# Patient Record
Sex: Male | Born: 1985 | Race: Black or African American | Hispanic: No | Marital: Married | Smoking: Former smoker
Health system: Southern US, Community
[De-identification: ages and names within clinical notes are randomized; demographics above are authoritative.]

## PROBLEM LIST (undated history)

## (undated) DIAGNOSIS — Z86718 Personal history of other venous thrombosis and embolism: Secondary | ICD-10-CM

## (undated) DIAGNOSIS — I2699 Other pulmonary embolism without acute cor pulmonale: Secondary | ICD-10-CM

---

## 2016-04-30 DIAGNOSIS — Z720 Tobacco use: Secondary | ICD-10-CM

## 2016-04-30 DIAGNOSIS — M79606 Pain in leg, unspecified: Secondary | ICD-10-CM

## 2016-04-30 DIAGNOSIS — I2699 Other pulmonary embolism without acute cor pulmonale: Secondary | ICD-10-CM

## 2016-04-30 DIAGNOSIS — I10 Essential (primary) hypertension: Secondary | ICD-10-CM

## 2016-06-05 DIAGNOSIS — I2699 Other pulmonary embolism without acute cor pulmonale: Secondary | ICD-10-CM

## 2016-08-21 DIAGNOSIS — N289 Disorder of kidney and ureter, unspecified: Secondary | ICD-10-CM

## 2016-08-21 DIAGNOSIS — Z7901 Long term (current) use of anticoagulants: Secondary | ICD-10-CM

## 2016-08-21 DIAGNOSIS — I10 Essential (primary) hypertension: Secondary | ICD-10-CM

## 2016-08-21 DIAGNOSIS — I2699 Other pulmonary embolism without acute cor pulmonale: Secondary | ICD-10-CM

## 2020-02-21 ENCOUNTER — Inpatient Hospital Stay (HOSPITAL_COMMUNITY)
Admission: AD | Admit: 2020-02-21 | Discharge: 2020-02-24 | DRG: 164 | Disposition: A | Discharge status: Home / Self Care | Payer: BC Managed Care – PPO | Source: Other Acute Inpatient Hospital | Attending: Family Medicine | Admitting: Family Medicine

## 2020-02-21 ENCOUNTER — Inpatient Hospital Stay (HOSPITAL_COMMUNITY): Payer: BC Managed Care – PPO

## 2020-02-21 DIAGNOSIS — I82442 Acute embolism and thrombosis of left tibial vein: Secondary | ICD-10-CM | POA: Diagnosis present

## 2020-02-21 DIAGNOSIS — I2692 Saddle embolus of pulmonary artery without acute cor pulmonale: Principal | ICD-10-CM | POA: Diagnosis present

## 2020-02-21 DIAGNOSIS — Z86718 Personal history of other venous thrombosis and embolism: Secondary | ICD-10-CM

## 2020-02-21 DIAGNOSIS — M7989 Other specified soft tissue disorders: Secondary | ICD-10-CM | POA: Diagnosis not present

## 2020-02-21 DIAGNOSIS — I10 Essential (primary) hypertension: Secondary | ICD-10-CM | POA: Diagnosis present

## 2020-02-21 DIAGNOSIS — F17210 Nicotine dependence, cigarettes, uncomplicated: Secondary | ICD-10-CM | POA: Diagnosis present

## 2020-02-21 DIAGNOSIS — I4510 Unspecified right bundle-branch block: Secondary | ICD-10-CM | POA: Diagnosis present

## 2020-02-21 DIAGNOSIS — I2602 Saddle embolus of pulmonary artery with acute cor pulmonale: Secondary | ICD-10-CM

## 2020-02-21 DIAGNOSIS — Z6841 Body Mass Index (BMI) 40.0 and over, adult: Secondary | ICD-10-CM

## 2020-02-21 DIAGNOSIS — E669 Obesity, unspecified: Secondary | ICD-10-CM | POA: Diagnosis present

## 2020-02-21 DIAGNOSIS — R000 Tachycardia, unspecified: Secondary | ICD-10-CM | POA: Diagnosis present

## 2020-02-21 DIAGNOSIS — I2699 Other pulmonary embolism without acute cor pulmonale: Secondary | ICD-10-CM

## 2020-02-21 DIAGNOSIS — I361 Nonrheumatic tricuspid (valve) insufficiency: Secondary | ICD-10-CM

## 2020-02-21 DIAGNOSIS — Z8249 Family history of ischemic heart disease and other diseases of the circulatory system: Secondary | ICD-10-CM

## 2020-02-21 DIAGNOSIS — F1290 Cannabis use, unspecified, uncomplicated: Secondary | ICD-10-CM | POA: Diagnosis present

## 2020-02-21 DIAGNOSIS — I2721 Secondary pulmonary arterial hypertension: Secondary | ICD-10-CM | POA: Diagnosis present

## 2020-02-21 DIAGNOSIS — I82432 Acute embolism and thrombosis of left popliteal vein: Secondary | ICD-10-CM | POA: Diagnosis present

## 2020-02-21 DIAGNOSIS — Z86711 Personal history of pulmonary embolism: Secondary | ICD-10-CM | POA: Diagnosis present

## 2020-02-21 DIAGNOSIS — Z20822 Contact with and (suspected) exposure to covid-19: Secondary | ICD-10-CM | POA: Diagnosis present

## 2020-02-21 DIAGNOSIS — I82452 Acute embolism and thrombosis of left peroneal vein: Secondary | ICD-10-CM | POA: Diagnosis present

## 2020-02-21 HISTORY — PX: IR ANGIOGRAM PULMONARY BILATERAL SELECTIVE: IMG664

## 2020-02-21 HISTORY — PX: IR ANGIOGRAM SELECTIVE EACH ADDITIONAL VESSEL: IMG667

## 2020-02-21 HISTORY — PX: IR THROMBECT VENO MECH MOD SED: IMG2300

## 2020-02-21 HISTORY — PX: IR US GUIDE VASC ACCESS RIGHT: IMG2390

## 2020-02-21 LAB — CBC WITH DIFFERENTIAL/PLATELET
Abs Immature Granulocytes: 0.38 10*3/uL — ABNORMAL HIGH (ref 0.00–0.07)
Basophils Absolute: 0.1 10*3/uL (ref 0.0–0.1)
Basophils Relative: 1 %
Eosinophils Absolute: 0.2 10*3/uL (ref 0.0–0.5)
Eosinophils Relative: 2 %
HCT: 44.9 % (ref 39.0–52.0)
Hemoglobin: 14.6 g/dL (ref 13.0–17.0)
Immature Granulocytes: 3 %
Lymphocytes Relative: 37 %
Lymphs Abs: 4.9 10*3/uL — ABNORMAL HIGH (ref 0.7–4.0)
MCH: 29.7 pg (ref 26.0–34.0)
MCHC: 32.5 g/dL (ref 30.0–36.0)
MCV: 91.4 fL (ref 80.0–100.0)
Monocytes Absolute: 0.8 10*3/uL (ref 0.1–1.0)
Monocytes Relative: 6 %
Neutro Abs: 6.8 10*3/uL (ref 1.7–7.7)
Neutrophils Relative %: 51 %
Platelets: 235 10*3/uL (ref 150–400)
RBC: 4.91 MIL/uL (ref 4.22–5.81)
RDW: 12.9 % (ref 11.5–15.5)
WBC: 13.1 10*3/uL — ABNORMAL HIGH (ref 4.0–10.5)
nRBC: 0 % (ref 0.0–0.2)

## 2020-02-21 LAB — COMPREHENSIVE METABOLIC PANEL
ALT: 123 U/L — ABNORMAL HIGH (ref 0–44)
AST: 76 U/L — ABNORMAL HIGH (ref 15–41)
Albumin: 4 g/dL (ref 3.5–5.0)
Alkaline Phosphatase: 55 U/L (ref 38–126)
Anion gap: 14 (ref 5–15)
BUN: 33 mg/dL — ABNORMAL HIGH (ref 6–20)
CO2: 17 mmol/L — ABNORMAL LOW (ref 22–32)
Calcium: 9.1 mg/dL (ref 8.9–10.3)
Chloride: 109 mmol/L (ref 98–111)
Creatinine, Ser: 1.29 mg/dL — ABNORMAL HIGH (ref 0.61–1.24)
GFR calc Af Amer: >60 mL/min (ref >60)
GFR calc non Af Amer: >60 mL/min (ref >60)
Glucose, Bld: 131 mg/dL — ABNORMAL HIGH (ref 70–99)
Potassium: 4.8 mmol/L (ref 3.5–5.1)
Sodium: 140 mmol/L (ref 135–145)
Total Bilirubin: 0.7 mg/dL (ref 0.3–1.2)
Total Protein: 7.4 g/dL (ref 6.5–8.1)

## 2020-02-21 LAB — MRSA PCR SCREENING: MRSA by PCR: NEGATIVE

## 2020-02-21 LAB — GLUCOSE, CAPILLARY
Glucose-Capillary: 117 mg/dL — ABNORMAL HIGH (ref 70–99)
Glucose-Capillary: 85 mg/dL (ref 70–99)

## 2020-02-21 LAB — POCT ACTIVATED CLOTTING TIME
Activated Clotting Time: 158 seconds
Activated Clotting Time: 213 seconds

## 2020-02-21 LAB — MAGNESIUM: Magnesium: 2.4 mg/dL (ref 1.7–2.4)

## 2020-02-21 LAB — CORTISOL: Cortisol, Plasma: 12.4 ug/dL

## 2020-02-21 LAB — PHOSPHORUS: Phosphorus: 5.1 mg/dL — ABNORMAL HIGH (ref 2.5–4.6)

## 2020-02-21 LAB — SARS CORONAVIRUS 2 BY RT PCR (HOSPITAL ORDER, PERFORMED IN ~~LOC~~ HOSPITAL LAB): SARS Coronavirus 2: NEGATIVE

## 2020-02-21 LAB — HIV ANTIBODY (ROUTINE TESTING W REFLEX): HIV Screen 4th Generation wRfx: NONREACTIVE

## 2020-02-21 LAB — HEPARIN LEVEL (UNFRACTIONATED): Heparin Unfractionated: 0.85 IU/mL — ABNORMAL HIGH (ref 0.30–0.70)

## 2020-02-21 LAB — BRAIN NATRIURETIC PEPTIDE: B Natriuretic Peptide: 205.9 pg/mL — ABNORMAL HIGH (ref 0.0–100.0)

## 2020-02-21 MED ORDER — POLYETHYLENE GLYCOL 3350 17 G PO PACK: 17.0000 g | PACK | Freq: Every day | ORAL | Status: DC | PRN

## 2020-02-21 MED ORDER — FENTANYL CITRATE (PF) 100 MCG/2ML IJ SOLN
INTRAMUSCULAR | Status: AC
  Filled 2020-02-21: qty 2

## 2020-02-21 MED ORDER — FENTANYL CITRATE (PF) 100 MCG/2ML IJ SOLN
INTRAMUSCULAR | Status: AC | PRN
  Administered 2020-02-21: 25 ug via INTRAVENOUS

## 2020-02-21 MED ORDER — LIDOCAINE HCL 1 % IJ SOLN
INTRAMUSCULAR | Status: AC
  Filled 2020-02-21: qty 20

## 2020-02-21 MED ORDER — IOHEXOL 240 MG/ML SOLN
INTRAMUSCULAR | Status: AC
  Filled 2020-02-21: qty 100

## 2020-02-21 MED ORDER — HYDRALAZINE HCL 10 MG PO TABS
10.0000 mg | ORAL_TABLET | Freq: Four times a day (QID) | ORAL | Status: DC | PRN
  Filled 2020-02-21: qty 1

## 2020-02-21 MED ORDER — MIDAZOLAM HCL 2 MG/2ML IJ SOLN
INTRAMUSCULAR | Status: AC
  Filled 2020-02-21: qty 2

## 2020-02-21 MED ORDER — DOCUSATE SODIUM 100 MG PO CAPS: 100.0000 mg | ORAL_CAPSULE | Freq: Two times a day (BID) | ORAL | Status: DC | PRN

## 2020-02-21 MED ORDER — MIDAZOLAM HCL 2 MG/2ML IJ SOLN
INTRAMUSCULAR | Status: AC | PRN
  Administered 2020-02-21: 0.5 mg via INTRAVENOUS
  Administered 2020-02-21: 1 mg via INTRAVENOUS

## 2020-02-21 MED ORDER — HEPARIN SODIUM (PORCINE) 1000 UNIT/ML IJ SOLN
INTRAMUSCULAR | Status: AC | PRN
  Administered 2020-02-21: 2000 [IU] via INTRAVENOUS
  Administered 2020-02-21: 5000 [IU] via INTRAVENOUS

## 2020-02-21 MED ORDER — HEPARIN (PORCINE) 25000 UT/250ML-% IV SOLN
1800.0000 [IU]/h | INTRAVENOUS | Status: DC
  Administered 2020-02-21: 1900 [IU]/h via INTRAVENOUS
  Administered 2020-02-21: 1800 [IU]/h via INTRAVENOUS
  Filled 2020-02-21: qty 250

## 2020-02-21 MED ORDER — HEPARIN SODIUM (PORCINE) 1000 UNIT/ML IJ SOLN
INTRAMUSCULAR | Status: AC
  Filled 2020-02-21: qty 1

## 2020-02-21 MED ORDER — SODIUM CHLORIDE 0.9 % IV SOLN
INTRAVENOUS | Status: AC | PRN
  Administered 2020-02-21: 10 mL/h via INTRAVENOUS

## 2020-02-21 MED ORDER — LIDOCAINE HCL 1 % IJ SOLN
INTRAMUSCULAR | Status: AC | PRN
  Administered 2020-02-21: 10 mL

## 2020-02-21 MED ORDER — IOHEXOL 300 MG/ML  SOLN
150.0000 mL | Freq: Once | INTRAMUSCULAR | Status: AC | PRN
  Administered 2020-02-22: 100 mL via INTRA_ARTERIAL

## 2020-02-21 NOTE — Progress Notes (Signed)
Patient transferred to IR for procedure.  Assisted by transport.  No complications during transfer.

## 2020-02-21 NOTE — Consult Note (Signed)
Chief Complaint: Patient was seen in consultation today for Pulmonary embolus thrombectomy vs thrombolysis   at the request of Dr Despina Hick   Supervising Physician: Arne Cleveland  Patient Status: [facility] - In-pt  History of Present Illness: Riley Wells is a 34 y.o. male   Hx PE- yrs ago Recently experienced cramping in left leg which is like when he had previous DVT Became syncopal after bath today SOB all day To RH:  CTA showing large PE Transferred to [facility] for CCM eval and possible IR treatment of PE Denies recent CVA; surgery or bleeding issues  Acute saddle PE- with R Heart strain- seen and evaluated with CCM Started on Heparin IV Now CCM requesting PE Thrombectomy vs thrombolysis in IR  Dr Vernard Gambles has reviewed chart and agreeable to proceed 97% Venturi mask   Allergies: Patient has no known allergies.  Medications: Prior to Admission medications   Medication Sig Start Date End Date Taking? Authorizing Provider  EC-NAPROXEN 500 MG EC tablet Take 500 mg by mouth 2 (two) times daily. 02/19/20  Yes [provider]  methocarbamol (ROBAXIN) 500 MG tablet Take 500 mg by mouth 2 (two) times daily. 02/19/20  Yes [provider]     No family history on file.  Social History   Socioeconomic History  . Marital status: Married    Spouse name: Not on file  . Number of children: Not on file  . Years of education: Not on file  . Highest education level: Not on file  Occupational History  . Not on file  Tobacco Use  . Smoking status: Not on file  Substance and Sexual Activity  . Alcohol use: Not on file  . Drug use: Not on file  . Sexual activity: Not on file  Other Topics Concern  . Not on file  Social History Narrative  . Not on file   Social Determinants of Health   Financial Resource Strain:   . Difficulty of Paying Living Expenses:   Food Insecurity:   . Worried About Charity fundraiser in the Last Year:   . Arboriculturist in  the Last Year:   Transportation Needs:   . Film/video editor (Medical):   Marland Kitchen Lack of Transportation (Non-Medical):   Physical Activity:   . Days of Exercise per Week:   . Minutes of Exercise per Session:   Stress:   . Feeling of Stress :   Social Connections:   . Frequency of Communication with Friends and Family:   . Frequency of Social Gatherings with Friends and Family:   . Attends Religious Services:   . Active Member of Clubs or Organizations:   . Attends Archivist Meetings:   Marland Kitchen Marital Status:     Review of Systems: A 12 point ROS discussed and pertinent positives are indicated in the HPI above.  All other systems are negative.  Review of Systems  Constitutional: Positive for activity change and fatigue. Negative for fever.  Respiratory: Positive for shortness of breath.   Neurological: Negative for weakness.  Psychiatric/Behavioral: Negative for confusion.    Vital Signs: BP (!) 125/98   Pulse (!) 118   Resp (!) 21   Ht 6\' 3"  (1.905 m)   Wt (!) 370 lb 9.5 oz (168.1 kg)   SpO2 98%   BMI 46.32 kg/m   Physical Exam Vitals reviewed.  Cardiovascular:     Rate and Rhythm: Normal rate and regular rhythm.  Heart sounds: Normal heart sounds.  Pulmonary:     Effort: Respiratory distress present.  Abdominal:     Palpations: Abdomen is soft.  Musculoskeletal:        General: Normal range of motion.  Skin:    General: Skin is warm and dry.  Neurological:     Mental Status: He is alert and oriented to person, place, and time.  Psychiatric:        Mood and Affect: Mood normal.        Behavior: Behavior normal.        Thought Content: Thought content normal.        Judgment: Judgment normal.     Imaging: No results found.  Labs:  CBC: Recent Labs    02/21/20 1317  WBC 13.1*  HGB 14.6  HCT 44.9  PLT 235    COAGS: No results for input(s): INR, APTT in the last 8760 hours.  BMP: Recent Labs    02/21/20 1317  NA 140  K 4.8  CL  109  CO2 17*  GLUCOSE 131*  BUN 33*  CALCIUM 9.1  CREATININE 1.29*  GFRNONAA >60  GFRAA >60    LIVER FUNCTION TESTS: Recent Labs    02/21/20 1317  BILITOT 0.7  AST 76*  ALT 123*  ALKPHOS 55  PROT 7.4  ALBUMIN 4.0    TUMOR MARKERS: No results for input(s): AFPTM, CEA, CA199, CHROMGRNA in the last 8760 hours.  Assessment and Plan:  Acute saddle PE with heart strain SOB and 97% Venturi mask Scheduled for PE thrombectomy - possible thrombolysis in IR Risks and benefits discussed with the patient including, but not limited to bleeding, possible life threatening bleeding and need for blood product transfusion, vascular injury, stroke, contrast induced renal failure, limb loss and infection.  All of the patient's questions were answered, patient is agreeable to proceed. Consent signed and in chart.   Thank you for this interesting consult.  I greatly enjoyed meeting Riley Wells and look forward to participating in their care.  A copy of this report was sent to the requesting provider on this date.  Electronically Signed: Robet Leu, PA-C 02/21/2020, 3:29 PM   I spent a total of 40 Minutes    in face to face in clinical consultation, greater than 50% of which was counseling/coordinating care for PE thrombectomy/thrombolysis

## 2020-02-21 NOTE — Procedures (Signed)
  Procedure: Bilat PA catheter  thrombectomy PA pressures 68/40(53)mmHg pre; 32/6(19) post EBL:   minimal Complications:  none immediate  See full dictation in [employer].  Thora Lance MD Main # [phone] Pager  [phone]

## 2020-02-21 NOTE — Progress Notes (Addendum)
ANTICOAGULATION CONSULT NOTE - Initial Consult  Pharmacy Consult for Heparin Indication: pulmonary embolus  No Known Allergies  Patient Measurements: Height: 6\' 3"  (190.5 cm) Weight: (!) 168.1 kg (370 lb 9.5 oz) IBW/kg (Calculated) : 84.5 Heparin Dosing Weight: 125 kg  Vital Signs: Temp: 97.4 F (36.3 C) (05/12 1532) Temp Source: Oral (05/12 1532) BP: 132/101 (05/12 1545) Pulse Rate: 119 (05/12 1545)  Labs: Recent Labs    02/21/20 1317 02/21/20 1522  HGB 14.6  --   HCT 44.9  --   PLT 235  --   HEPARINUNFRC  --  0.85*  CREATININE 1.29*  --     Estimated Creatinine Clearance: 135.8 mL/min (A) (by C-G formula based on SCr of 1.29 mg/dL (H)).   Medical History: No past medical history on file.  Medications:  Infusions:  . heparin 1,900 Units/hr (02/21/20 1500)    Assessment: 34 year old male transferred to [facility] from [facility] with saddle pulmonary embolism. Heparin was initiated at [facility] - Heparin bolus of 6000 units at ~9AM and drip at 1900 units/hr. Pharmacy consulted to continue Heparin dosing.   Heparin level 0.85 which is supratherapeutic CBC is within normal limits and stable.  No bleeding reported.   Goal of Therapy:  Heparin level 0.3-0.7 units/ml Monitor platelets by anticoagulation protocol: Yes   Plan:  Decrease Heparin to 1800 units/hr.  Heparin level in 6hr  Heparin level and CBC daily Follow-up plan for possible lysis  ADDENDUM: Patient in interventional radiology at the time we called the unit for the change in the heparin rate.  Will follow-up with [facility] plans post IR.  ADDENDUM 2:  Continue heparin drip at 1800 units/hr post-IR Heparin level in 6hr  [facility], PharmD, [facility] Clinical Pharmacist Please see AMION for all Pharmacists' Contact Phone Numbers 02/21/2020, 4:20 PM

## 2020-02-21 NOTE — Progress Notes (Signed)
[facility]  Diastolic pressure remains too high for systemic thrombolysis Consulted IR for mechanical thrombectomy  Heber Hackberry, MD [place] [facility] Pager: [phone] Cell: [phone] If no response, call [phone]

## 2020-02-21 NOTE — Sedation Documentation (Signed)
EBL 

## 2020-02-21 NOTE — Progress Notes (Addendum)
[facility]  Full H&P to follow.  Briefly I have seen and examined the patient.  He tells me that he has a history of PE, intermittent cramping in his left calf over the last few months which is similar to DVT pain he had years ago.  He was having some back pain recently so he lay in a warm bath today and suddenly became lightheaded, nearly blacked out, and then felt palpitations and dizziness.  He has felt very short of breath since then.   Today he went to the hospital and was found to have a large PE.  Requiring high amounts of oxygen to maintain normal oxygenation  No recent fevers or cough  No stroke history No recent surgery No blood thinners No recent trauma No history of malignancy  On exam HR 130's, BP 131/90, RR 32, O2 saturation 94% on 60% venturi  General:  Resting comfortably in bed HENT: NCAT OP clear PULM: CTA B, normal effort CV: tachycardic, no mgr GI: BS+, soft, nontender MSK: normal bulk and tone Neuro: awake, alert, no distress, MAEW  Labs from OSH reviewed, WBC 17, Hgb OK, PLT OK, INR 1.0, Cr 1.6, anion gap 18 Echo showed LVEF 60-65%, RV "moderately impaired" but not well visulaized CT angiogram: acute saddle PE, RV:LV ratio 2.1 , BNP 900  Impression/plan: Acute saddle PE with pre-syncope, significant RV strain: discussed risks and benefits of heparin alone vs thrombolysis which could potentially shorten his duration of symptoms and possibly lower his risk of severe complication (death, heart failure, need for life support).  I noted a 1-3% risk of severe life threatening bleeding.  After discussion he is willing to proceed.  Will monitor hemodynamics here for the next 15-20 minutes then if stable will start alteplase.  My cc time 35 minutes  Heber Edie, MD [place] [facility] Pager: [phone] Cell: [phone] If no response, call [phone]

## 2020-02-21 NOTE — H&P (Signed)
NAME:  Riley Wells, MRN:  [id number], DOB:  19-Oct-1985, LOS: 0 ADMISSION DATE:  02/21/2020, CONSULTATION DATE:  02/21/20 REFERRING MD:  Joanette Gula, CHIEF COMPLAINT:  Massive PE    History of Present Illness   [name] is a 34 yo M with h/o obesity who presented to [facility] ED with shortness of breath of one day duration. He reports he was taking Epsom salt baths for his back at 730 yesterday evening when he became short of breath. He got out of the bath and had difficulty sleeping that night. He returned to the bath at 4 AM this morning and again his shortness of breath worsened. His SOB was accompanied by retrosternal chest pain that was constant at the center of his chest. He did not have leg pain or swelling. He got out of the bath and sat for 45 minutes but continued to be unable to catch his breath. He does endorse a brief period of blackout. He checked his HR (133), BP (150s/110), and SpO2 (90%) which were concerning for him, and his wife drove him to the ED.   In the ED, Tmax 97.3, BP 131/78, HR 131, SpO2 98% RA. Initial workup significant for Cr 1.60, WBC 17.5. EKG showed R axis deviation, new RBBB. Lactate wnl. Trop 0.25. CTA showed acute saddle PE with evidence of R heart strain c/w PE. TTE showed LVEF 55-60%, RV enlarged, RV fx moderately impaired, mild-mod TR. He received bolus 6,000u heparing gtt and heparin 1600u/hr thereafter. At this point, [facility] was called and he was admitted for further mgmt.   Presently, he reports his SOB and chest pain have improved with oxygen. He says that on Friday, his back pain was so bad that he laid on a heating pad for about 8 hours. No recent surgeries, trips, or otherwise limited mobility.  He denies SOB/chest pain at baseline. Denies anginal sx, and is very active playing basketball. He does endorse one DVT/PE in 2018 that was provoked by long car trip to [place]; he was on Xarelto for 6 months following.  He says he has had ongoing calf  pain since January; at that point, he had gone to the ED and gotten PVLs to below the knee which did not reveal clot, though he contends they did not go high enough. He denies similar leg pain/swelling/redness at this time.   He denies family history of clotting or bleeding disorders. Father died of heart attack in 34s.  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 Smokes 1ppd for 16 years, occasional alcohol, and regular marijuana use.   Past Medical History  Obesity DVT/PE in 2018, provoked, s/p Xarelto 6 months  HTN, not on home medications  Significant Hospital Events   5/11: transferred to [facility] PCCM   Consults:  - IR 5/12  Procedures:  -  Significant Diagnostic Tests:  5/12 OSH: CTA:  acute saddle PE with evidence of R heart strain c/w PE.  5/12 OSH TTE: LVEF 55-60%, RV enlarged, RV fx moderately impaired, mild-mod TR, RV systolic pressure 53.  Micro Data:  -  Antimicrobials:  -  Interim history/subjective:  NAE since admission. Pt resting comfortably in his room. HR 110s, BP 130s/110, SpO2 98% on NRB.   Objective   Blood pressure 119/85, pulse (!) 118, resp. rate 20, height 6\' 3"  (1.905 m), weight (!) 168.1 kg, SpO2 98 %.        Intake/Output Summary (Last 24 hours) at 02/21/2020 1442 Last data  filed at 02/21/2020 1300 Gross per 24 hour  Intake 3.04 ml  Output --  Net 3.04 ml   Filed Weights   02/21/20 1200  Weight: (!) 168.1 kg    Examination: Gen: Well appearing in NAD. A&O x3. Answers questions appropriately HEENT: atraumatic, normocephalic, sclera anicteric, EOMI, PERRLA, MMM. OP w/o erythema or exudate  Neck: no cervical lymphadenopathy, no JVD Heart: tachycardia, regular rhythm, S1, S2, no M/R/G, no chest wall tenderness Lungs: CTAB, no crackles or wheezes, normal work of breathing Abdomen: Normoactive bowel sounds, soft, NTND, no rebound/guarding Extremities: no clubbing, cyanosis, or edema: pulses are +2 in bilateral upper and lower  extremities Neuro: CN II-XI grossly intact. No focal deficits. Skin:  No rashes, lesions  Psych: Normal mood and affect.   Resolved Hospital Problem list   none  Assessment & Plan:   34 yo M with h/o obesity presented to [facility] ED with shortness of breath of two days duration found to have massive PE.   Submassive saddle PE: Pt presents with calf pain since January with new SOB/chest pain for 48 hours; found to have submassive saddle PE on CTA. Had provoked DVT/PE in 2018 (car trip to [place]) and was on 6 months of Xarelto. His current PE may also be provoked by prolonged immobility d/t back pain, however he has had on and off calf pain since January. No FHx clots. His SOB and chest pain improved with administration of O2 on NRB. S/p 6,000u heparin bolus now on heparin 1900u/hr gtt. TTE at OSH showed R heart strain per report. We had a lengthy discussion regarding the risks and benefits of thrombolytic therapy. Pt denies CVA/stroke, brain tumor, GI bleeds, other bleeds, recent surgeries, or current anticoagulation use. However, his BP continues to have diastolics 100-120s. We will not give TPA at this time; we'll consult IR for intraarterial thrombolysis.  - IR consulted for intraarterial thrombolysis - PVLs - Heparin gtt  - CBC diff, CMP, Mg, Phos - HIV - Cortisol - pro-BNP - Daily CBC, Bmet - Tele  - Strict I/O, daily weights to assess for heart dysfunction - Continue resp support  - Will need chronic anticoagulation   HTN: Pt endorses history of HTN but has been trying diet/exercise to avoid taking home medicines. BP has been high since presentation most recently 130s/100s. This precludes him from TPA at this time.  - Hydralazine 10mg  prn   FEN:  - Cmet pending  - Regular Diet   Best practice:  Diet: Regular diet   Pain/Anxiety/Delirium protocol (if indicated): na VAP protocol (if indicated): per protocol DVT prophylaxis: heparin gtt  GI prophylaxis: na Glucose control:  na Mobility: Bedrest  Code Status: FULL Family Communication: Wife inbound  Disposition:  ICU   Labs   CBC: Recent Labs  Lab 02/21/20 1317  WBC 13.1*  NEUTROABS 6.8  HGB 14.6  HCT 44.9  MCV 91.4  PLT 235    Basic Metabolic Panel: Recent Labs  Lab 02/21/20 1317  NA 140  K 4.8  CL 109  CO2 17*  GLUCOSE 131*  BUN 33*  CREATININE 1.29*  CALCIUM 9.1  MG 2.4  PHOS 5.1*   GFR: Estimated Creatinine Clearance: 135.8 mL/min (A) (by C-G formula based on SCr of 1.29 mg/dL (H)). Recent Labs  Lab 02/21/20 1317  WBC 13.1*    Liver Function Tests: Recent Labs  Lab 02/21/20 1317  AST 76*  ALT 123*  ALKPHOS 55  BILITOT 0.7  PROT 7.4  ALBUMIN 4.0  No results for input(s): LIPASE, AMYLASE in the last 168 hours. No results for input(s): AMMONIA in the last 168 hours.  ABG No results found for: PHART, PCO2ART, PO2ART, HCO3, TCO2, ACIDBASEDEF, O2SAT   Coagulation Profile: No results for input(s): INR, PROTIME in the last 168 hours.  Cardiac Enzymes: No results for input(s): CKTOTAL, CKMB, CKMBINDEX, TROPONINI in the last 168 hours.  HbA1C: No results found for: HGBA1C  CBG: No results for input(s): GLUCAP in the last 168 hours.  Review of Systems:   Constitutional: Denies changes in weight.  Psych: Denies changes in mood or sleep Neuro: Denies headaches, ataxia, or falls HEENT: Denies changes in vision, smell, or taste. Denies rhinorrhea, congestion. Denies difficulty swallowing. + tinnitus with chest pain/sob.  Cardiovascular:as per hpi   Respiratory: as per hpi Gastrointestinal: Denies nausea/vomiting, diarrhea, constipation, blood in stool, or change in stool quality Genitourinary: Denies dysuria, frequency, urgency Musculoskeletal: Denies joint pain, swelling, or stiffness. Back pain per hpi  Skin: Denies new rashes or lesions  Endocrine: Denies heat/cold intolerance, changes in appetite  Past Medical History  He,  has no past medical history on  file.   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 Smokes 1ppd for 16 years, occasional alcohol, and regular marijuana use.   Family History   His family history is not on file.  denies family history of clotting or bleeding disorders. Father died of heart attack in 10s. Allergies No Known Allergies   Home Medications  Prior to Admission medications   Not on File     Ivin Poot, MS4

## 2020-02-21 NOTE — Sedation Documentation (Signed)
Right pulmonary artery pressure 68/40 mean 53

## 2020-02-21 NOTE — Sedation Documentation (Signed)
Main pulmonary artery pressure 32/6 mean 19

## 2020-02-21 NOTE — Progress Notes (Signed)
Pt arrived from [facility] per ACLS - admitted to 2M07. Pt alert & oriented on 02 - did say he was short of breath. Critical care MD, Dr Kendrick Fries here at bedside.

## 2020-02-21 NOTE — Progress Notes (Signed)
ANTICOAGULATION CONSULT NOTE - Initial Consult  Pharmacy Consult for Heparin Indication: pulmonary embolus  No Known Allergies  Patient Measurements: Height: 6\' 3"  (190.5 cm) Weight: (!) 168.1 kg (370 lb 9.5 oz) IBW/kg (Calculated) : 84.5 Heparin Dosing Weight: 125 kg  Vital Signs: BP: 119/85 (05/12 1400) Pulse Rate: 118 (05/12 1400)  Labs: Recent Labs    02/21/20 1317  HGB 14.6  HCT 44.9  PLT 235    CrCl cannot be calculated (No successful lab value found.).   Medical History: No past medical history on file.  Medications:  Infusions:  . heparin 1,900 Units/hr (02/21/20 1300)    Assessment: 34 year old male transferred to [facility] from [facility] with saddle pulmonary embolism. Heparin was initiated at [facility] - Heparin bolus of 6000 units at ~9AM and drip at 1900 units/hr. Pharmacy consulted to continue Heparin dosing.   CBC is within normal limits and stable.  No bleeding reported.  No anticoagulation prior to admission. CCM considering IV versus IR-guided thrombolysis.   Goal of Therapy:  Heparin level 0.3-0.7 units/ml Monitor platelets by anticoagulation protocol: Yes   Plan:  Continue Heparin at 1900 units/hr.  Heparin level at 15:30 PM (~6hr level) Follow-up plan for possible lysis  [facility], PharmD, BCPS, BCCCP Clinical Pharmacist Please refer to [facility] for [facility] Pharmacy numbers 02/21/2020,2:28 PM

## 2020-02-21 NOTE — Progress Notes (Signed)
Interventional Radiology Brief Note:  Riley Wells is a 34 year old male transferred from [facility] with acute saddle PE with reported significant RV dysfunction.  IR was consulted by EDP at [facility] for possible need for thrombectomy vs. thrombolysis.  Patient transferred to 50M under the care of CCM.  They have evaluated the patient and are initiating systemic tPA.  IR remains available if needed.  Dr. Deanne Coffer is aware of patient and current admission.   Loyce Dys, MS RD PA-C

## 2020-02-22 ENCOUNTER — Inpatient Hospital Stay (HOSPITAL_COMMUNITY): Payer: BC Managed Care – PPO

## 2020-02-22 DIAGNOSIS — M7989 Other specified soft tissue disorders: Secondary | ICD-10-CM

## 2020-02-22 DIAGNOSIS — I2699 Other pulmonary embolism without acute cor pulmonale: Secondary | ICD-10-CM

## 2020-02-22 LAB — CBC
HCT: 43 % (ref 39.0–52.0)
Hemoglobin: 13.7 g/dL (ref 13.0–17.0)
MCH: 29 pg (ref 26.0–34.0)
MCHC: 31.9 g/dL (ref 30.0–36.0)
MCV: 91.1 fL (ref 80.0–100.0)
Platelets: 197 10*3/uL (ref 150–400)
RBC: 4.72 MIL/uL (ref 4.22–5.81)
RDW: 12.8 % (ref 11.5–15.5)
WBC: 11.1 10*3/uL — ABNORMAL HIGH (ref 4.0–10.5)
nRBC: 0 % (ref 0.0–0.2)

## 2020-02-22 LAB — PHOSPHORUS: Phosphorus: 4.2 mg/dL (ref 2.5–4.6)

## 2020-02-22 LAB — BASIC METABOLIC PANEL
Anion gap: 10 (ref 5–15)
BUN: 27 mg/dL — ABNORMAL HIGH (ref 6–20)
CO2: 20 mmol/L — ABNORMAL LOW (ref 22–32)
Calcium: 9 mg/dL (ref 8.9–10.3)
Chloride: 111 mmol/L (ref 98–111)
Creatinine, Ser: 1.24 mg/dL (ref 0.61–1.24)
GFR calc Af Amer: >60 mL/min (ref >60)
GFR calc non Af Amer: >60 mL/min (ref >60)
Glucose, Bld: 106 mg/dL — ABNORMAL HIGH (ref 70–99)
Potassium: 4.2 mmol/L (ref 3.5–5.1)
Sodium: 141 mmol/L (ref 135–145)

## 2020-02-22 LAB — HEPARIN LEVEL (UNFRACTIONATED)
Heparin Unfractionated: 1.52 IU/mL — ABNORMAL HIGH (ref 0.30–0.70)
Heparin Unfractionated: 0.79 IU/mL — ABNORMAL HIGH (ref 0.30–0.70)
Heparin Unfractionated: 0.45 IU/mL (ref 0.30–0.70)
Heparin Unfractionated: 0.35 IU/mL (ref 0.30–0.70)

## 2020-02-22 LAB — MAGNESIUM: Magnesium: 2.2 mg/dL (ref 1.7–2.4)

## 2020-02-22 MED ORDER — CHLORHEXIDINE GLUCONATE CLOTH 2 % EX PADS
6.0000 | MEDICATED_PAD | Freq: Every day | CUTANEOUS | Status: DC
  Administered 2020-02-22 – 2020-02-24 (×2): 6 via TOPICAL

## 2020-02-22 MED ORDER — HYDROCHLOROTHIAZIDE 25 MG PO TABS
25.0000 mg | ORAL_TABLET | Freq: Every day | ORAL | Status: DC
  Administered 2020-02-22 – 2020-02-24 (×3): 25 mg via ORAL
  Filled 2020-02-22 (×3): qty 1

## 2020-02-22 MED ORDER — HEPARIN (PORCINE) 25000 UT/250ML-% IV SOLN
1400.0000 [IU]/h | INTRAVENOUS | Status: DC
  Administered 2020-02-22: 1400 [IU]/h via INTRAVENOUS
  Filled 2020-02-22: qty 250

## 2020-02-22 NOTE — Care Management (Addendum)
Benefit check submitted for Cost of Eliquis (10mg  BID x7 days then 5mg  BID) vs Xarelto (15mg  BID x21 days then 20mg  daily).  Results will be documented in note by TOC.  Pt will be able to utilize both free 30 day card and reduced copay card for both Eliquis and Xarelto. Benefit check submitted to see if prior is requested

## 2020-02-22 NOTE — Progress Notes (Signed)
ANTICOAGULATION CONSULT NOTE - Follow Up Consult  Pharmacy Consult for heparin Indication: pulmonary embolus, now s/p thrombectomy  Labs: Recent Labs    02/21/20 1317 02/21/20 1522 02/21/20 2352  HGB 14.6  --   --   HCT 44.9  --   --   PLT 235  --   --   HEPARINUNFRC  --  0.85* 1.52*  CREATININE 1.29*  --   --     Assessment: 33yo male supratherapeutic on heparin with higher heparin level despite decreased rate; no gtt issues or signs of bleeding per RN; had been to IR for thrombectomy and did receive boluses.  Goal of Therapy:  Heparin level 0.3-0.7 units/ml   Plan:  Will hold heparin gtt x81min then decrease heparin gtt to 1500 units/hr and check level in 6 hours.    Vernard Gambles, PharmD, BCPS  02/22/2020,1:34 AM

## 2020-02-22 NOTE — Progress Notes (Addendum)
Attending:    Subjective: Mechanical thrombectomy yesterday On 2L [place] Heparin restarted  Objective: Vitals:   02/22/20 0430 02/22/20 0700 02/22/20 0716 02/22/20 0800  BP: 102/81 (!) 112/97  130/68  Pulse: 96 99  98  Resp: 20 17  18   Temp:   97.9 F (36.6 C)   TempSrc:   Oral   SpO2: 97% 95%  97%  Weight:      Height:       FiO2 (%):  [45 %] 45 %  Intake/Output Summary (Last 24 hours) at 02/22/2020 0901 Last data filed at 02/22/2020 0800 Gross per 24 hour  Intake 853.37 ml  Output 700 ml  Net 153.37 ml    General:  Resting comfortably in bed HENT: NCAT OP clear PULM: CTA B, normal effort CV: RRR, no mgr GI: BS+, soft, nontender MSK: normal bulk and tone Neuro: awake, alert, no distress, MAEW    CBC    Component Value Date/Time   WBC 11.1 (H) 02/22/2020 0247   RBC 4.72 02/22/2020 0247   HGB 13.7 02/22/2020 0247   HCT 43.0 02/22/2020 0247   PLT 197 02/22/2020 0247   MCV 91.1 02/22/2020 0247   MCH 29.0 02/22/2020 0247   MCHC 31.9 02/22/2020 0247   RDW 12.8 02/22/2020 0247   LYMPHSABS 4.9 (H) 02/21/2020 1317   MONOABS 0.8 02/21/2020 1317   EOSABS 0.2 02/21/2020 1317   BASOSABS 0.1 02/21/2020 1317    BMET    Component Value Date/Time   NA 141 02/22/2020 0247   K 4.2 02/22/2020 0247   CL 111 02/22/2020 0247   CO2 20 (L) 02/22/2020 0247   GLUCOSE 106 (H) 02/22/2020 0247   BUN 27 (H) 02/22/2020 0247   CREATININE 1.24 02/22/2020 0247   CALCIUM 9.0 02/22/2020 0247   GFRNONAA >60 02/22/2020 0247   GFRAA >60 02/22/2020 0247      Impression/Plan: Acute saddle pulmonary embolism > maintain heparin today, switch to oral anticoagulant tomorrow, ultrasound left leg;  Needs lifelong anticoagulation; high risk for CTEPH; will need pulmonary follow up and repeat echo in 3-6 months -His follow up appointment will be with [facility] 02/24/2020 NP) on May 27 at 10:00.   Hypertension: start HCTZ, check BMET in AM, consider adding second agent  FEN:  advance diet  My cc time n/a  May 29, MD [place] [facility] Pager: [phone] Cell: [phone] After 3pm or if no response, call [phone]

## 2020-02-22 NOTE — Progress Notes (Signed)
NAME:  Riley Wells, MRN:  [id number], DOB:  [birth year], LOS: 1 ADMISSION DATE:  02/21/2020, CONSULTATION DATE:  02/21/20 REFERRING MD:  Arsenio Katz, CHIEF COMPLAINT:  Massive PE    History of Present Illness   [name] is a 34 yo M with h/o obesity who presented to [facility] ED with shortness of breath and chest pain of 2 days duration found to have a submassive saddle PE with evidence of R heart strain. TTE showed LVEF 55-60%, RV enlarged, RV fx moderately impaired, mild-mod TR. He received bolus 6,000u heparing gtt and heparin 1600u/hr thereafter. He was transferred to [facility] for mgmt.   Notably, he has had ongoing calf pain since January; at that point, he had gone to the ED and gotten PVLs to below the knee which did not reveal clot, though he contends they did not go high enough. He says that on Friday, his back pain was so bad that he laid on a heating pad for about 8 hours. No recent surgeries, trips, or otherwise limited mobility. He does endorse one DVT/PE in 2018 that was provoked by long car trip to [place]; he was on Xarelto for 6 months following. He denies similar leg pain/swelling/redness at this time.   He denies family history of clotting or bleeding disorders. Smokes 1ppd for 16 years.   Past Medical History  Obesity DVT/PE in 2018, provoked, s/p Xarelto 6 months  HTN, not on home medications  Significant Hospital Events   5/11: transferred to [place]:  - IR 5/12  Procedures:  - 5/12: Mechanical thrombectomy   Significant Diagnostic Tests:  5/12 OSH: CTA:  acute saddle PE with evidence of R heart strain c/w PE.  5/12 OSH TTE: LVEF 55-60%, RV enlarged, RV fx moderately impaired, mild-mod TR, RV systolic pressure 53.  Micro Data:  - SARS-CoV-2 negative (5/12)  Antimicrobials:  - none  Interim history/subjective:  NAE since admission. Diastolic Bps remained too high for systemic thrombolysis, and pt was taken for mechanical thrombectomy  with IR. Procedure uncomplicated. Heparin was restarted and he is resting comfortably. VSS. BP now 110s/80s-90s. SpO2 98% on 2LNC.   Objective   Blood pressure (!) 112/97, pulse 99, temperature 97.9 F (36.6 C), temperature source Oral, resp. rate 17, height 6\' 3"  (1.905 m), weight (!) 168.1 kg, SpO2 95 %.    FiO2 (%):  [45 %] 45 %   Intake/Output Summary (Last 24 hours) at 02/22/2020 0734 Last data filed at 02/22/2020 0300 Gross per 24 hour  Intake 613.37 ml  Output 700 ml  Net -86.63 ml   Filed Weights   02/21/20 1200  Weight: (!) 168.1 kg    Examination: Gen: Well appearing in NAD. A&O x3. Answers questions appropriately HEENT: atraumatic, normocephalic, sclera anicteric, EOMI, PERRLA, MMM. OP w/o erythema or exudate  Neck: no cervical lymphadenopathy, no JVD Heart: tachycardia, regular rhythm, S1, S2, no M/R/G, no chest wall tenderness Lungs: CTAB, no crackles or wheezes, normal work of breathing Abdomen: Normoactive bowel sounds, soft, NTND, no rebound/guarding Extremities: no clubbing, cyanosis, or edema: pulses are +2 in bilateral upper and lower extremities Neuro: CN II-XI grossly intact. No focal deficits. Skin:  No rashes, lesions  Psych: Normal mood and affect.   Resolved Hospital Problem list   none  Assessment & Plan:   34 yo M with h/o obesity presented to [facility] ED with shortness of breath of two days duration found to have massive PE.   Submassive saddle  PE: Pt presents with calf pain since January with new SOB/chest pain for 48 hours; found to have submassive saddle PE on CTA. Had provoked DVT/PE in 2018 (car trip to [place]) and was on 6 months of Xarelto. He has had on and off calf pain since January. Blacked out briefly during current episode. No FHx clots. TTE at OSH showed LVEF 55-60, diminished RV fx, and R heart strain. Pro-BNP 206. On arrival, his diastolic pressures were elevated to 120s, too high for systemic thrombolysis, and he underwent mechanical  thrombectomy with IR. They evacuated large clot and he's done well post-procedurally.  - PVLs today - Heparin gtt today; will transition to DOAC tomorrow (needs lifetime) - Needs pulmonary f/u - agreed oto see [facility] on 5/27 at 10:00  - Daily CBC, Bmet - Tele  - Strict I/O, daily weights  - Continue resp support, doing well on [facility]  - Floor status   HTN: Pt endorses history of HTN but has been trying diet/exercise to avoid taking home medicines. BP has been high since presentation most recently 130s/100s. This precludes him from TPA at this time.  - Begin HCTZ 25 mg qd today ; BMP in AM; considering 2nd agent  - Hydralazine 10mg  prn   FEN: Lytes wnl.  - Advance to Regular Diet   Best practice:  Diet: Regular diet   Pain/Anxiety/Delirium protocol (if indicated): na VAP protocol (if indicated): per protocol DVT prophylaxis: heparin gtt  GI prophylaxis: na Glucose control: na Mobility: Bedrest  Code Status: FULL Family Communication: Wife inbound  Disposition:  Floor     , MS4

## 2020-02-22 NOTE — Progress Notes (Signed)
ANTICOAGULATION CONSULT NOTE  Pharmacy Consult for Heparin Indication: pulmonary embolus  No Known Allergies  Patient Measurements: Height: 6\' 3"  (190.5 cm) Weight: (!) 168.1 kg (370 lb 9.5 oz) IBW/kg (Calculated) : 84.5 Heparin Dosing Weight: 125 kg  Vital Signs: Temp: 98.1 F (36.7 C) (05/13 1520) Temp Source: Oral (05/13 1520) BP: 115/78 (05/13 1300) Pulse Rate: 108 (05/13 1400)  Labs: Recent Labs    02/21/20 1317 02/21/20 1522 02/21/20 2352 02/22/20 0247 02/22/20 0813 02/22/20 1452  HGB 14.6  --   --  13.7  --   --   HCT 44.9  --   --  43.0  --   --   PLT 235  --   --  197  --   --   HEPARINUNFRC  --    < > 1.52*  --  0.79* 0.45  CREATININE 1.29*  --   --  1.24  --   --    < > = values in this interval not displayed.    Estimated Creatinine Clearance: 141.3 mL/min (by C-G formula based on SCr of 1.24 mg/dL).   Medications:  Infusions:  . heparin 1,400 Units/hr (02/22/20 1400)    Assessment: 34 year old male transferred to [facility] from [facility] with saddle pulmonary embolism. Heparin was initiated at [facility]. Pharmacy consulted to continue Heparin dosing. Pt is S/P bilateral mechanical thrombectomy yesterday.  Vascular U/S today: findings consistent with acute DVT in LLE  Heparin level ~6 hrs after heparin infusion was decreased to 1400 units/hr was 0.45 units/ml, which is within the goal range for this pt. CBC WNL. Per RN, no issues with IV or bleeding observed.  Goal of Therapy:  Heparin level 0.3-0.7 units/ml Monitor platelets by anticoagulation protocol: Yes   Plan:  Continue heparin at 1400 units/hr Check confirmatory heparin level in 6 hrs Monitor daily heparin level, CBC Monitor for signs/symptoms of bleeding F/U transition to oral anticoagulant  [facility], PharmD, BCPS, [facility] Clinical Pharmacist 02/22/2020, 3:51 PM

## 2020-02-22 NOTE — TOC Benefit Eligibility Note (Addendum)
Transition of Care [facility]) Benefit Eligibility Note    Patient Details  Name: Riley Wells MRN: [id number] Date of Birth: 09/17/1986   Medication/Dose: CO-PAY- $50.00    XARELTO 20 MG DAILY  CO-PAY $50.00  Covered?: Yes  Tier: 3 Drug  Prescription Coverage Preferred Pharmacy: [facility] with Person/Company/Phone Number:: BARBARA  @ [employer] # [phone]  Co-Pay: $50.00  Prior Approval: No  Deductible: Met(OUT-OF-POCKET:UNMET)  Additional Notes: ELIQUIS 19M : Crecencio Mc Phone Number: 02/22/2020, 1:41 PM

## 2020-02-22 NOTE — Progress Notes (Signed)
ANTICOAGULATION CONSULT NOTE  Pharmacy Consult for Heparin Indication: pulmonary embolus  No Known Allergies  Patient Measurements: Height: 6\' 3"  (190.5 cm) Weight: (!) 168.1 kg (370 lb 9.5 oz) IBW/kg (Calculated) : 84.5 Heparin Dosing Weight: 125 kg  Vital Signs: Temp: 97.9 F (36.6 C) (05/13 0716) Temp Source: Oral (05/13 0716) BP: 130/68 (05/13 0800) Pulse Rate: 98 (05/13 0800)  Labs: Recent Labs    02/21/20 1317 02/21/20 1522 02/21/20 2352 02/22/20 0247 02/22/20 0813  HGB 14.6  --   --  13.7  --   HCT 44.9  --   --  43.0  --   PLT 235  --   --  197  --   HEPARINUNFRC  --  0.85* 1.52*  --  0.79*  CREATININE 1.29*  --   --  1.24  --     Estimated Creatinine Clearance: 141.3 mL/min (by C-G formula based on SCr of 1.24 mg/dL).   Medical History: No past medical history on file.  Medications:  Infusions:  . heparin 1,500 Units/hr (02/22/20 0800)    Assessment: 35 year old male transferred to [facility] from [facility] with saddle pulmonary embolism. Heparin was initiated at [facility]. Pharmacy consulted to continue Heparin dosing.   S/p mechanical thrombectomy yesterday  Heparin level 0.79  which is slightly supra-therapeutic on 1500 units/hr CBC is within normal limits and stable.  No bleeding reported.   Goal of Therapy:  Heparin level 0.3-0.7 units/ml Monitor platelets by anticoagulation protocol: Yes   Plan:  Decrease Heparin to 1400 units/hr.  Heparin level in 6hr  Heparin level and CBC daily  [facility], PharmD, BCPS, BCCCP Clinical Pharmacist Please refer to [facility] for [facility] Pharmacy numbers 02/22/2020, 9:07 AM

## 2020-02-22 NOTE — Progress Notes (Signed)
ANTICOAGULATION CONSULT NOTE  Pharmacy Consult for Heparin Indication: pulmonary embolus  Assessment: 34 year old male transferred to [facility] from [facility] with saddle pulmonary embolism. Heparin was initiated at [facility]. Pharmacy consulted to continue Heparin dosing. Pt is S/P bilateral mechanical thrombectomy yesterday.  Vascular U/S today: findings consistent with acute DVT in LLE  Heparin level 0.35 units/hr  Goal of Therapy:  Heparin level 0.3-0.7 units/ml Monitor platelets by anticoagulation protocol: Yes   Plan:  Continue heparin at 1400 units/hr Monitor daily heparin level, CBC Monitor for signs/symptoms of bleeding F/U transition to oral anticoagulant  Thanks for allowing pharmacy to be a part of this patient's care.  Talbert Cage, PharmD Clinical Pharmacist 02/22/2020, 11:03 PM

## 2020-02-22 NOTE — Progress Notes (Signed)
VASCULAR LAB PRELIMINARY  PRELIMINARY  PRELIMINARY  PRELIMINARY  Left lower extremity venous duplex completed.    Preliminary report:  See Cv proc for preliminary results.  Messaged Dr. Kendrick Fries with results.  Cordelro Gautreau, RVT 02/22/2020, 3:06 PM

## 2020-02-22 NOTE — Progress Notes (Signed)
    Referring Physician(s): McQuaid,D  Supervising Physician: Malachy Moan  Patient Status:  [facility] - In-pt  Chief Complaint: Recent dyspnea, pulmonary embolus    Subjective: Pt feeling much better this am; denies CP, worsening dyspnea; has occ cough, no hemoptysis   Allergies: Patient has no known allergies.  Medications: Prior to Admission medications   Medication Sig Start Date End Date Taking? Authorizing Provider  EC-NAPROXEN 500 MG EC tablet Take 500 mg by mouth 2 (two) times daily. 02/19/20  Yes [provider]  methocarbamol (ROBAXIN) 500 MG tablet Take 500 mg by mouth 2 (two) times daily. 02/19/20  Yes [provider]     Vital Signs: BP 130/68 (BP Location: Left Arm)   Pulse 98   Temp 97.9 F (36.6 C) (Oral)   Resp 18   Ht 6\' 3"  (1.905 m)   Wt (!) 370 lb 9.5 oz (168.1 kg)   SpO2 97%   BMI 46.32 kg/m   Physical Exam awake/alert; no resp distress, rt groin /CFV access site ok, purse string suture in place; purse string suture removed at bedside and gauze dressing applied to site  Imaging: No results found.  Labs:  CBC: Recent Labs    02/21/20 1317 02/22/20 0247  WBC 13.1* 11.1*  HGB 14.6 13.7  HCT 44.9 43.0  PLT 235 197    COAGS: No results for input(s): INR, APTT in the last 8760 hours.  BMP: Recent Labs    02/21/20 1317 02/22/20 0247  NA 140 141  K 4.8 4.2  CL 109 111  CO2 17* 20*  GLUCOSE 131* 106*  BUN 33* 27*  CALCIUM 9.1 9.0  CREATININE 1.29* 1.24  GFRNONAA >60 >60  GFRAA >60 >60    LIVER FUNCTION TESTS: Recent Labs    02/21/20 1317  BILITOT 0.7  AST 76*  ALT 123*  ALKPHOS 55  PROT 7.4  ALBUMIN 4.0    Assessment and Plan: Pt with hx acute saddle PE with RV strain, s/p bilat cath thrombectomy 5/12; currently stable; on IV heparin, afebrile; WBC 11.1(13.1), hgb stable, creat nl; further plans as per CCM   Electronically Signed: D. 7/12, PA-C 02/22/2020, 9:14 AM   I spent a total of  20 minutes at the the patient's bedside AND on the patient's hospital floor or unit, greater than 50% of which was counseling/coordinating care for pulmonary arteriogram with catheter directed thrombectomy for pulmonary embolus    Patient ID: 02/24/2020, male   DOB: [birth year], 34 y.o.   MRN: [id number]

## 2020-02-23 DIAGNOSIS — Z86718 Personal history of other venous thrombosis and embolism: Secondary | ICD-10-CM

## 2020-02-23 DIAGNOSIS — E669 Obesity, unspecified: Secondary | ICD-10-CM

## 2020-02-23 LAB — COMPREHENSIVE METABOLIC PANEL
ALT: 79 U/L — ABNORMAL HIGH (ref 0–44)
AST: 36 U/L (ref 15–41)
Albumin: 3.7 g/dL (ref 3.5–5.0)
Alkaline Phosphatase: 45 U/L (ref 38–126)
Anion gap: 8 (ref 5–15)
BUN: 16 mg/dL (ref 6–20)
CO2: 22 mmol/L (ref 22–32)
Calcium: 9 mg/dL (ref 8.9–10.3)
Chloride: 107 mmol/L (ref 98–111)
Creatinine, Ser: 1.09 mg/dL (ref 0.61–1.24)
GFR calc Af Amer: >60 mL/min (ref >60)
GFR calc non Af Amer: >60 mL/min (ref >60)
Glucose, Bld: 113 mg/dL — ABNORMAL HIGH (ref 70–99)
Potassium: 3.9 mmol/L (ref 3.5–5.1)
Sodium: 137 mmol/L (ref 135–145)
Total Bilirubin: 1 mg/dL (ref 0.3–1.2)
Total Protein: 6.7 g/dL (ref 6.5–8.1)

## 2020-02-23 LAB — HEPARIN LEVEL (UNFRACTIONATED): Heparin Unfractionated: 0.39 IU/mL (ref 0.30–0.70)

## 2020-02-23 MED ORDER — APIXABAN 5 MG PO TABS
5.0000 mg | ORAL_TABLET | Freq: Two times a day (BID) | ORAL | 2 refills | Status: DC
Start: 2020-02-23 — End: 2020-02-23

## 2020-02-23 MED ORDER — APIXABAN 5 MG PO TABS
5.0000 mg | ORAL_TABLET | Freq: Two times a day (BID) | ORAL | 2 refills | Status: DC
Start: 2020-02-23 — End: 2021-09-15

## 2020-02-23 MED ORDER — APIXABAN (ELIQUIS) VTE STARTER PACK (10MG AND 5MG)
ORAL_TABLET | ORAL | 0 refills | Status: DC
Start: 2020-02-23 — End: 2021-05-16

## 2020-02-23 MED ORDER — APIXABAN 5 MG PO TABS
10.0000 mg | ORAL_TABLET | Freq: Two times a day (BID) | ORAL | Status: DC
  Administered 2020-02-23 – 2020-02-24 (×3): 10 mg via ORAL
  Filled 2020-02-23 (×3): qty 2

## 2020-02-23 MED ORDER — METHOCARBAMOL 500 MG PO TABS: 500.0000 mg | ORAL_TABLET | Freq: Three times a day (TID) | ORAL | Status: DC | PRN

## 2020-02-23 MED ORDER — APIXABAN (ELIQUIS) VTE STARTER PACK (10MG AND 5MG)
ORAL_TABLET | ORAL | 3 refills | Status: DC
Start: 2020-02-23 — End: 2020-02-23

## 2020-02-23 MED ORDER — APIXABAN 5 MG PO TABS: 5.0000 mg | ORAL_TABLET | Freq: Two times a day (BID) | ORAL | Status: DC

## 2020-02-23 MED ORDER — TRAMADOL HCL 50 MG PO TABS: 100.0000 mg | ORAL_TABLET | Freq: Four times a day (QID) | ORAL | Status: DC | PRN

## 2020-02-23 NOTE — Progress Notes (Signed)
PROGRESS NOTE    Riley Wells  ZOX:[id number] DOB: 03/03/1986 DOA: 02/21/2020 PCP: Patient, No Pcp Per   Brief Narrative: Riley Wells is a 34 y.o. male with a history of obesity, previous DVT. Patient presented secondary to the hospital secondary to shortness of breath and found to have a saddle embolism with evidence of right heart strain with RV enlargement and moderately impaired RV function. Patient underwent successful mechanical thrombectomy per IR. Patient managed on Heparin drip.   Assessment & Plan:   Active Problems:   Acute pulmonary embolism (HCC)   Submassive PE Acute left DVT Associated right heart strain. Patient underwent successful thrombectomy and has been managed on heparin drip. On room air with normal and stable vitals -Transition to Eliquis -Likely discharge tomorrow if stable -Ambulatory pulse ox -Recommend hematology follow-up  Obesity Body mass index is 46.32 kg/m.   DVT prophylaxis: Eliquis Code Status:   Code Status: Full Code Family Communication: None at bedside Disposition Plan: Discharge home tomorrow if remains stable on oral anticoagulation   Consultants:   PCCM  Interventional radiology  Procedures:   BILATERAL PA CATHETER THROMBECTOMY (02/21/2020)  BILATERAL LE VENOUS DUPLEX (02/22/2020) Summary:  RIGHT:  - Unable to visualize CFV secondary to bandages. FV is WNL    LEFT:  - Findings consistent with acute deep vein thrombosis involving the left  popliteal vein, left posterior tibial veins, and left peroneal veins.   Antimicrobials:  None   Subjective: No chest pain, dyspnea. Some back pain.  Objective: Vitals:   02/22/20 1957 02/22/20 2000 02/22/20 2043 02/23/20 0809  BP:  120/66 119/84 116/67  Pulse:  (!) 103 (!) 101 94  Resp:  (!) Temp: 98.1 F (36.7 C)  (!) 97.5 F (36.4 C) 97.7 F (36.5 C)  TempSrc: Oral   Oral  SpO2:  94% 99% 98%  Weight:      Height:        Intake/Output Summary  (Last 24 hours) at 02/23/2020 1155 Last data filed at 02/23/2020 0815 Gross per 24 hour  Intake 758.41 ml  Output 850 ml  Net -91.59 ml   Filed Weights   02/21/20 1200  Weight: (!) 168.1 kg    Examination:  General exam: Appears calm and comfortable Respiratory system: Clear to auscultation. Respiratory effort normal. Cardiovascular system: S1 & S2 heard, RRR. No murmurs, rubs, gallops or clicks. Gastrointestinal system: Abdomen is nondistended, soft and nontender. No organomegaly or masses felt. Normal bowel sounds heard. Central nervous system: Alert and oriented. No focal neurological deficits. Extremities: No calf tenderness Skin: No cyanosis. No rashes Psychiatry: Judgement and insight appear normal. Mood & affect appropriate.     Data Reviewed: I have personally reviewed following labs and imaging studies  CBC: Recent Labs  Lab 02/21/20 1317 02/22/20 0247  WBC 13.1* 11.1*  NEUTROABS 6.8  --   HGB 14.6 13.7  HCT 44.9 43.0  MCV 91.4 91.1  PLT 235 197   Basic Metabolic Panel: Recent Labs  Lab 02/21/20 1317 02/22/20 0247 02/23/20 0429  NA 140 141 137  K 4.8 4.2 3.9  CL 109 111 107  CO2 17* 20* 22  GLUCOSE 131* 106* 113*  BUN 33* 27* 16  CREATININE 1.29* 1.24 1.09  CALCIUM 9.1 9.0 9.0  MG 2.4 2.2  --   PHOS 5.1* 4.2  --    GFR: Estimated Creatinine Clearance: 160.7 mL/min (by C-G formula based on SCr of 1.09 mg/dL). Liver Function Tests: Recent Labs  Lab 02/21/20 1317 02/23/20 0429  AST 76* 36  ALT 123* 79*  ALKPHOS 55 45  BILITOT 0.7 1.0  PROT 7.4 6.7  ALBUMIN 4.0 3.7   No results for input(s): LIPASE, AMYLASE in the last 168 hours. No results for input(s): AMMONIA in the last 168 hours. Coagulation Profile: No results for input(s): INR, PROTIME in the last 168 hours. Cardiac Enzymes: No results for input(s): CKTOTAL, CKMB, CKMBINDEX, TROPONINI in the last 168 hours. BNP (last 3 results) No results for input(s): PROBNP in the last 8760  hours. HbA1C: No results for input(s): HGBA1C in the last 72 hours. CBG: Recent Labs  Lab 02/21/20 1531 02/21/20 1916  GLUCAP 117* 85   Lipid Profile: No results for input(s): CHOL, HDL, LDLCALC, TRIG, CHOLHDL, LDLDIRECT in the last 72 hours. Thyroid Function Tests: No results for input(s): TSH, T4TOTAL, FREET4, T3FREE, THYROIDAB in the last 72 hours. Anemia Panel: No results for input(s): VITAMINB12, FOLATE, FERRITIN, TIBC, IRON, RETICCTPCT in the last 72 hours. Sepsis Labs: No results for input(s): PROCALCITON, LATICACIDVEN in the last 168 hours.  Recent Results (from the past 240 hour(s))  MRSA PCR Screening     Status: None   Collection Time: 02/21/20 12:02 PM   Specimen: Nasal Mucosa; Nasopharyngeal  Result Value Ref Range Status   MRSA by PCR NEGATIVE NEGATIVE Final    Comment:        The GeneXpert MRSA Assay (FDA approved for NASAL specimens only), is one component of a comprehensive MRSA colonization surveillance program. It is not intended to diagnose MRSA infection nor to guide or monitor treatment for MRSA infections. Performed at [facility], [address] [place], [place] [address]   SARS Coronavirus 2 by RT PCR (hospital order, performed in [facility] hospital lab) Nasopharyngeal Nasopharyngeal Swab     Status: None   Collection Time: 02/21/20 12:47 PM   Specimen: Nasopharyngeal Swab  Result Value Ref Range Status   SARS Coronavirus 2 NEGATIVE NEGATIVE Final    Comment: (NOTE) SARS-CoV-2 target nucleic acids are NOT DETECTED. The SARS-CoV-2 RNA is generally detectable in upper and lower respiratory specimens during the acute phase of infection. The lowest concentration of SARS-CoV-2 viral copies this assay can detect is 250 copies / mL. A negative result does not preclude SARS-CoV-2 infection and should not be used as the sole basis for treatment or other patient management decisions.  A negative result may occur with improper specimen  collection / handling, submission of specimen other than nasopharyngeal swab, presence of viral mutation(s) within the areas targeted by this assay, and inadequate number of viral copies (<250 copies / mL). A negative result must be combined with clinical observations, patient history, and epidemiological information. Fact Sheet for Patients:   BoilerBrush.com.cy Fact Sheet for Healthcare Providers: https://pope.com/ This test is not yet approved or cleared  by the [place] FDA and has been authorized for detection and/or diagnosis of SARS-CoV-2 by FDA under an Emergency Use Authorization (EUA).  This EUA will remain in effect (meaning this test can be used) for the duration of the COVID-19 declaration under Section 564(b)(1) of the Act, 21 U.S.C. section 360bbb-3(b)(1), unless the authorization is terminated or revoked sooner. Performed at [facility], [address] [place], [place] [address]          Radiology Studies: IR Angiogram Pulmonary Bilateral Selective  Result Date: 02/22/2020 INDICATION: Submassive bilateral central pulmonary emboli with pulmonary arterial hypertension, RV strain, shortness of breath, tachycardia. Hypertension.  EXAM: BILATERAL PULMONARY ARTERIOGRAM LEFT PULMONARY ARTERIAL THROMBECTOMY RIGHT PULMONARY ARTERIAL THROMBECTOMY ULTRASOUND GUIDANCE FOR VASCULAR ACCESS INFERIOR VENA CAVOGRAM COMPARISON:  CTA from same day MEDICATIONS: Lidocaine 1% subcutaneous, heparin 7000 units ANESTHESIA/SEDATION: Intravenous Fentanyl and Versed 1.5mg  were administered as conscious sedation during continuous monitoring of the patient's level of consciousness and physiological / cardiorespiratory status by the radiology RN, with a total moderate sedation time of 90 minutes. TECHNIQUE: Informed written consent was obtained from the patient after a thorough discussion of the procedural risks, benefits and alternatives.  All questions were addressed. Maximal Sterile Barrier Technique was utilized including caps, mask, sterile gowns, sterile gloves, sterile drape, hand hygiene and skin antiseptic. A timeout was performed prior to the initiation of the procedure. Patency and complete compressibility of bilateral common femoral veins was confirmed with ultrasound and documented. After surgical prep and local lidocaine administration, micropuncture access to right common femoral vein achieved under ultrasound guidance, exchanged over a Bentson wire for a 7 [place] vascular sheath. Outflow right iliac vein and inferior cavogram performed. 6 French angled pigtail catheter advanced into the right pulmonary artery. Pressure measurements obtained. Pulmonary arteriogram performed. Patient was heparinized with 4000 units heparin, and ACT was maintained greater than 200 for the remainder of the procedure, with additional intermittent boluses as needed. Catheter exchanged for a 5 French vert catheter, negotiated into lower lobe branch right pulmonary artery, removed over a stiff Amplatz wire. Vascular sheath was upsized, and 24 [place] FlowTriever device advanced into distal interlobar branch right pulmonary artery. Suction thrombectomy performed. Follow-up selective right pulmonary arteriography was obtained. The catheter was then redirected to the origin of the left pulmonary artery for left pulmonary arteriography. The 5 [place] vert catheter was coaxially advanced for additional selective left pulmonary arteriography. FlowTriever device advanced into distal left pulmonary artery. Suction mechanical thrombectomy performed. Follow-up selective left pulmonary arteriogram obtained. Catheter was retracted into the distal main pulmonary artery for final pulmonary arteriography and pressure measurements. Guidewire, catheter and sheath were removed and hemostasis achieved with aid of 0 Prolene pursestring suture. The patient tolerated the procedure  well. FLUOROSCOPY TIME:  25.8 minutes; 21458 uGym2 DAP COMPLICATIONS: None immediate. FINDINGS: Outflow right iliac venogram and inferior vena cavagram show no iliocaval thrombus or occlusion. Initial right pulmonary arterial pressure 68/40(53) mmHg. Selective right pulmonary arteriogram shows saddle embolus across its bifurcation with partially occlusive clot extending into the intralobar branch and lower lobe segmental branches. After right pulmonary arterial selective suction thrombectomy, there is clearance of the central clot with some residual partially occlusive clot in distal interlobar and lower lobe segmental branches. Selective left pulmonary arteriogram demonstrates near occlusive thrombus in the proximal left pulmonary artery extending through its length. After left pulmonary artery selective suction thrombectomy, clearance of thrombus from the left pulmonary artery with improved branch perfusion. Final pulmonary arterial pressure 32/6(19) mmHg. IMPRESSION: 1. Negative for right iliac or inferior vena cava thrombus. 2. Bilateral central pulmonary emboli with markedly elevated pulmonary arterial pressures. 3. Good response to selective right and left pulmonary arterial thrombectomy, with near normalization of pulmonary arterial pressures. Electronically Signed   By: Corlis Leak M.D.   On: 02/22/2020 10:01   IR Angiogram Selective Each Additional Vessel  Result Date: 02/22/2020 INDICATION: Submassive bilateral central pulmonary emboli with pulmonary arterial hypertension, RV strain, shortness of breath, tachycardia. Hypertension. EXAM: BILATERAL PULMONARY ARTERIOGRAM LEFT PULMONARY ARTERIAL THROMBECTOMY RIGHT PULMONARY ARTERIAL THROMBECTOMY ULTRASOUND GUIDANCE FOR VASCULAR ACCESS INFERIOR VENA CAVOGRAM COMPARISON:  CTA from same day MEDICATIONS: Lidocaine 1%  subcutaneous, heparin 7000 units ANESTHESIA/SEDATION: Intravenous Fentanyl 25mcg and Versed 1.5mg  were administered as conscious sedation during  continuous monitoring of the patient's level of consciousness and physiological / cardiorespiratory status by the radiology RN, with a total moderate sedation time of 90 minutes. TECHNIQUE: Informed written consent was obtained from the patient after a thorough discussion of the procedural risks, benefits and alternatives. All questions were addressed. Maximal Sterile Barrier Technique was utilized including caps, mask, sterile gowns, sterile gloves, sterile drape, hand hygiene and skin antiseptic. A timeout was performed prior to the initiation of the procedure. Patency and complete compressibility of bilateral common femoral veins was confirmed with ultrasound and documented. After surgical prep and local lidocaine administration, micropuncture access to right common femoral vein achieved under ultrasound guidance, exchanged over a Bentson wire for a 7 [place] vascular sheath. Outflow right iliac vein and inferior cavogram performed. 6 French angled pigtail catheter advanced into the right pulmonary artery. Pressure measurements obtained. Pulmonary arteriogram performed. Patient was heparinized with 4000 units heparin, and ACT was maintained greater than 200 for the remainder of the procedure, with additional intermittent boluses as needed. Catheter exchanged for a 5 French vert catheter, negotiated into lower lobe branch right pulmonary artery, removed over a stiff Amplatz wire. Vascular sheath was upsized, and 24 [place] FlowTriever device advanced into distal interlobar branch right pulmonary artery. Suction thrombectomy performed. Follow-up selective right pulmonary arteriography was obtained. The catheter was then redirected to the origin of the left pulmonary artery for left pulmonary arteriography. The 5 [place] vert catheter was coaxially advanced for additional selective left pulmonary arteriography. FlowTriever device advanced into distal left pulmonary artery. Suction mechanical thrombectomy performed.  Follow-up selective left pulmonary arteriogram obtained. Catheter was retracted into the distal main pulmonary artery for final pulmonary arteriography and pressure measurements. Guidewire, catheter and sheath were removed and hemostasis achieved with aid of 0 Prolene pursestring suture. The patient tolerated the procedure well. FLUOROSCOPY TIME:  25.8 minutes; 21458 uGym2 DAP COMPLICATIONS: None immediate. FINDINGS: Outflow right iliac venogram and inferior vena cavagram show no iliocaval thrombus or occlusion. Initial right pulmonary arterial pressure 68/40(53) mmHg. Selective right pulmonary arteriogram shows saddle embolus across its bifurcation with partially occlusive clot extending into the intralobar branch and lower lobe segmental branches. After right pulmonary arterial selective suction thrombectomy, there is clearance of the central clot with some residual partially occlusive clot in distal interlobar and lower lobe segmental branches. Selective left pulmonary arteriogram demonstrates near occlusive thrombus in the proximal left pulmonary artery extending through its length. After left pulmonary artery selective suction thrombectomy, clearance of thrombus from the left pulmonary artery with improved branch perfusion. Final pulmonary arterial pressure 32/6(19) mmHg. IMPRESSION: 1. Negative for right iliac or inferior vena cava thrombus. 2. Bilateral central pulmonary emboli with markedly elevated pulmonary arterial pressures. 3. Good response to selective right and left pulmonary arterial thrombectomy, with near normalization of pulmonary arterial pressures. Electronically Signed   By: Corlis Leak  Hassell M.D.   On: 02/22/2020 10:01   IR Angiogram Selective Each Additional Vessel  Result Date: 02/22/2020 INDICATION: Submassive bilateral central pulmonary emboli with pulmonary arterial hypertension, RV strain, shortness of breath, tachycardia. Hypertension. EXAM: BILATERAL PULMONARY ARTERIOGRAM LEFT PULMONARY  ARTERIAL THROMBECTOMY RIGHT PULMONARY ARTERIAL THROMBECTOMY ULTRASOUND GUIDANCE FOR VASCULAR ACCESS INFERIOR VENA CAVOGRAM COMPARISON:  CTA from same day MEDICATIONS: Lidocaine 1% subcutaneous, heparin 7000 units ANESTHESIA/SEDATION: Intravenous Fentanyl 25mcg and Versed 1.5mg  were administered as conscious sedation during continuous monitoring of the patient's level of consciousness and physiological /  cardiorespiratory status by the radiology RN, with a total moderate sedation time of 90 minutes. TECHNIQUE: Informed written consent was obtained from the patient after a thorough discussion of the procedural risks, benefits and alternatives. All questions were addressed. Maximal Sterile Barrier Technique was utilized including caps, mask, sterile gowns, sterile gloves, sterile drape, hand hygiene and skin antiseptic. A timeout was performed prior to the initiation of the procedure. Patency and complete compressibility of bilateral common femoral veins was confirmed with ultrasound and documented. After surgical prep and local lidocaine administration, micropuncture access to right common femoral vein achieved under ultrasound guidance, exchanged over a Bentson wire for a 7 [place] vascular sheath. Outflow right iliac vein and inferior cavogram performed. 6 French angled pigtail catheter advanced into the right pulmonary artery. Pressure measurements obtained. Pulmonary arteriogram performed. Patient was heparinized with 4000 units heparin, and ACT was maintained greater than 200 for the remainder of the procedure, with additional intermittent boluses as needed. Catheter exchanged for a 5 French vert catheter, negotiated into lower lobe branch right pulmonary artery, removed over a stiff Amplatz wire. Vascular sheath was upsized, and 24 [place] FlowTriever device advanced into distal interlobar branch right pulmonary artery. Suction thrombectomy performed. Follow-up selective right pulmonary arteriography was obtained.  The catheter was then redirected to the origin of the left pulmonary artery for left pulmonary arteriography. The 5 [place] vert catheter was coaxially advanced for additional selective left pulmonary arteriography. FlowTriever device advanced into distal left pulmonary artery. Suction mechanical thrombectomy performed. Follow-up selective left pulmonary arteriogram obtained. Catheter was retracted into the distal main pulmonary artery for final pulmonary arteriography and pressure measurements. Guidewire, catheter and sheath were removed and hemostasis achieved with aid of 0 Prolene pursestring suture. The patient tolerated the procedure well. FLUOROSCOPY TIME:  25.8 minutes; 21458 uGym2 DAP COMPLICATIONS: None immediate. FINDINGS: Outflow right iliac venogram and inferior vena cavagram show no iliocaval thrombus or occlusion. Initial right pulmonary arterial pressure 68/40(53) mmHg. Selective right pulmonary arteriogram shows saddle embolus across its bifurcation with partially occlusive clot extending into the intralobar branch and lower lobe segmental branches. After right pulmonary arterial selective suction thrombectomy, there is clearance of the central clot with some residual partially occlusive clot in distal interlobar and lower lobe segmental branches. Selective left pulmonary arteriogram demonstrates near occlusive thrombus in the proximal left pulmonary artery extending through its length. After left pulmonary artery selective suction thrombectomy, clearance of thrombus from the left pulmonary artery with improved branch perfusion. Final pulmonary arterial pressure 32/6(19) mmHg. IMPRESSION: 1. Negative for right iliac or inferior vena cava thrombus. 2. Bilateral central pulmonary emboli with markedly elevated pulmonary arterial pressures. 3. Good response to selective right and left pulmonary arterial thrombectomy, with near normalization of pulmonary arterial pressures. Electronically Signed   By: Corlis Leak M.D.   On: 02/22/2020 10:01   IR THROMBECT VENO MECH MOD SED  Result Date: 02/22/2020 INDICATION: Submassive bilateral central pulmonary emboli with pulmonary arterial hypertension, RV strain, shortness of breath, tachycardia. Hypertension. EXAM: BILATERAL PULMONARY ARTERIOGRAM LEFT PULMONARY ARTERIAL THROMBECTOMY RIGHT PULMONARY ARTERIAL THROMBECTOMY ULTRASOUND GUIDANCE FOR VASCULAR ACCESS INFERIOR VENA CAVOGRAM COMPARISON:  CTA from same day MEDICATIONS: Lidocaine 1% subcutaneous, heparin 7000 units ANESTHESIA/SEDATION: Intravenous Fentanyl and Versed 1.5mg  were administered as conscious sedation during continuous monitoring of the patient's level of consciousness and physiological / cardiorespiratory status by the radiology RN, with a total moderate sedation time of 90 minutes. TECHNIQUE: Informed written consent was obtained from the patient after a thorough discussion of  the procedural risks, benefits and alternatives. All questions were addressed. Maximal Sterile Barrier Technique was utilized including caps, mask, sterile gowns, sterile gloves, sterile drape, hand hygiene and skin antiseptic. A timeout was performed prior to the initiation of the procedure. Patency and complete compressibility of bilateral common femoral veins was confirmed with ultrasound and documented. After surgical prep and local lidocaine administration, micropuncture access to right common femoral vein achieved under ultrasound guidance, exchanged over a Bentson wire for a 7 [place] vascular sheath. Outflow right iliac vein and inferior cavogram performed. 6 French angled pigtail catheter advanced into the right pulmonary artery. Pressure measurements obtained. Pulmonary arteriogram performed. Patient was heparinized with 4000 units heparin, and ACT was maintained greater than 200 for the remainder of the procedure, with additional intermittent boluses as needed. Catheter exchanged for a 5 French vert catheter,  negotiated into lower lobe branch right pulmonary artery, removed over a stiff Amplatz wire. Vascular sheath was upsized, and 24 [place] FlowTriever device advanced into distal interlobar branch right pulmonary artery. Suction thrombectomy performed. Follow-up selective right pulmonary arteriography was obtained. The catheter was then redirected to the origin of the left pulmonary artery for left pulmonary arteriography. The 5 [place] vert catheter was coaxially advanced for additional selective left pulmonary arteriography. FlowTriever device advanced into distal left pulmonary artery. Suction mechanical thrombectomy performed. Follow-up selective left pulmonary arteriogram obtained. Catheter was retracted into the distal main pulmonary artery for final pulmonary arteriography and pressure measurements. Guidewire, catheter and sheath were removed and hemostasis achieved with aid of 0 Prolene pursestring suture. The patient tolerated the procedure well. FLUOROSCOPY TIME:  25.8 minutes; 51025 uGym2 DAP COMPLICATIONS: None immediate. FINDINGS: Outflow right iliac venogram and inferior vena cavagram show no iliocaval thrombus or occlusion. Initial right pulmonary arterial pressure 68/40(53) mmHg. Selective right pulmonary arteriogram shows saddle embolus across its bifurcation with partially occlusive clot extending into the intralobar branch and lower lobe segmental branches. After right pulmonary arterial selective suction thrombectomy, there is clearance of the central clot with some residual partially occlusive clot in distal interlobar and lower lobe segmental branches. Selective left pulmonary arteriogram demonstrates near occlusive thrombus in the proximal left pulmonary artery extending through its length. After left pulmonary artery selective suction thrombectomy, clearance of thrombus from the left pulmonary artery with improved branch perfusion. Final pulmonary arterial pressure 32/6(19) mmHg. IMPRESSION: 1.  Negative for right iliac or inferior vena cava thrombus. 2. Bilateral central pulmonary emboli with markedly elevated pulmonary arterial pressures. 3. Good response to selective right and left pulmonary arterial thrombectomy, with near normalization of pulmonary arterial pressures. Electronically Signed   By: Lucrezia Europe M.D.   On: 02/22/2020 10:01   IR US Guide Vasc Access Right  Result Date: 02/22/2020 INDICATION: Submassive bilateral central pulmonary emboli with pulmonary arterial hypertension, RV strain, shortness of breath, tachycardia. Hypertension. EXAM: BILATERAL PULMONARY ARTERIOGRAM LEFT PULMONARY ARTERIAL THROMBECTOMY RIGHT PULMONARY ARTERIAL THROMBECTOMY ULTRASOUND GUIDANCE FOR VASCULAR ACCESS INFERIOR VENA CAVOGRAM COMPARISON:  CTA from same day MEDICATIONS: Lidocaine 1% subcutaneous, heparin 7000 units ANESTHESIA/SEDATION: Intravenous Fentanyl 41mcg and Versed 1.5mg  were administered as conscious sedation during continuous monitoring of the patient's level of consciousness and physiological / cardiorespiratory status by the radiology RN, with a total moderate sedation time of 90 minutes. TECHNIQUE: Informed written consent was obtained from the patient after a thorough discussion of the procedural risks, benefits and alternatives. All questions were addressed. Maximal Sterile Barrier Technique was utilized including caps, mask, sterile gowns, sterile gloves, sterile drape, hand hygiene and skin  antiseptic. A timeout was performed prior to the initiation of the procedure. Patency and complete compressibility of bilateral common femoral veins was confirmed with ultrasound and documented. After surgical prep and local lidocaine administration, micropuncture access to right common femoral vein achieved under ultrasound guidance, exchanged over a Bentson wire for a 7 [place] vascular sheath. Outflow right iliac vein and inferior cavogram performed. 6 French angled pigtail catheter advanced into the right  pulmonary artery. Pressure measurements obtained. Pulmonary arteriogram performed. Patient was heparinized with 4000 units heparin, and ACT was maintained greater than 200 for the remainder of the procedure, with additional intermittent boluses as needed. Catheter exchanged for a 5 French vert catheter, negotiated into lower lobe branch right pulmonary artery, removed over a stiff Amplatz wire. Vascular sheath was upsized, and 24 [place] FlowTriever device advanced into distal interlobar branch right pulmonary artery. Suction thrombectomy performed. Follow-up selective right pulmonary arteriography was obtained. The catheter was then redirected to the origin of the left pulmonary artery for left pulmonary arteriography. The 5 [place] vert catheter was coaxially advanced for additional selective left pulmonary arteriography. FlowTriever device advanced into distal left pulmonary artery. Suction mechanical thrombectomy performed. Follow-up selective left pulmonary arteriogram obtained. Catheter was retracted into the distal main pulmonary artery for final pulmonary arteriography and pressure measurements. Guidewire, catheter and sheath were removed and hemostasis achieved with aid of 0 Prolene pursestring suture. The patient tolerated the procedure well. FLUOROSCOPY TIME:  25.8 minutes; [id number] uGym2 DAP COMPLICATIONS: None immediate. FINDINGS: Outflow right iliac venogram and inferior vena cavagram show no iliocaval thrombus or occlusion. Initial right pulmonary arterial pressure 68/40(53) mmHg. Selective right pulmonary arteriogram shows saddle embolus across its bifurcation with partially occlusive clot extending into the intralobar branch and lower lobe segmental branches. After right pulmonary arterial selective suction thrombectomy, there is clearance of the central clot with some residual partially occlusive clot in distal interlobar and lower lobe segmental branches. Selective left pulmonary arteriogram  demonstrates near occlusive thrombus in the proximal left pulmonary artery extending through its length. After left pulmonary artery selective suction thrombectomy, clearance of thrombus from the left pulmonary artery with improved branch perfusion. Final pulmonary arterial pressure 32/6(19) mmHg. IMPRESSION: 1. Negative for right iliac or inferior vena cava thrombus. 2. Bilateral central pulmonary emboli with markedly elevated pulmonary arterial pressures. 3. Good response to selective right and left pulmonary arterial thrombectomy, with near normalization of pulmonary arterial pressures. Electronically Signed   By: Corlis Leak M.D.   On: 02/22/2020 10:01   VAS [place] LOWER EXTREMITY VENOUS (DVT)  Result Date: 02/22/2020  Lower Venous DVT Study Indications: Pulmonary embolism.  Risk Factors: Confirmed PE thrombectomy. Limitations: Body habitus, bandages and patient unable to tolerate compression in groin. Comparison Study: No prior study on file for comparison Performing Technologist: Sherren Kerns RVS  Examination Guidelines: A complete evaluation includes B-mode imaging, spectral Doppler, color Doppler, and power Doppler as needed of all accessible portions of each vessel. Bilateral testing is considered an integral part of a complete examination. Limited examinations for reoccurring indications may be performed as noted. The reflux portion of the exam is performed with the patient in reverse Trendelenburg.  +-----+---------------+---------+-----------+----------+--------------+  RIGHT Compressibility Phasicity Spontaneity Properties Thrombus Aging  +-----+---------------+---------+-----------+----------+--------------+  CFV  bandages        +-----+---------------+---------+-----------+----------+--------------+   +---------+---------------+---------+-----------+----------+--------------+  LEFT      Compressibility Phasicity Spontaneity Properties Thrombus Aging   +---------+---------------+---------+-----------+----------+--------------+  CFV                       Yes       Yes                                    +---------+---------------+---------+-----------+----------+--------------+  FV Prox   Full            Yes       Yes                                    +---------+---------------+---------+-----------+----------+--------------+  FV Mid    Full                                                             +---------+---------------+---------+-----------+----------+--------------+  FV Distal Full                                                             +---------+---------------+---------+-----------+----------+--------------+  PFV       Full                                                             +---------+---------------+---------+-----------+----------+--------------+  POP       None            No        No                     Acute           +---------+---------------+---------+-----------+----------+--------------+  PTV       None                                             Acute           +---------+---------------+---------+-----------+----------+--------------+  PERO      None                                             Acute           +---------+---------------+---------+-----------+----------+--------------+  Gastroc   Full                                                             +---------+---------------+---------+-----------+----------+--------------+  Summary: RIGHT: - Unable to visualize CFV secondary to bandages. FV is WNL  LEFT: - Findings consistent with acute deep vein thrombosis involving the left popliteal vein, left posterior tibial veins, and left peroneal veins.  *See table(s) above for measurements and observations. Electronically signed by Lemar Livings MD on 02/22/2020 at 5:40:40 PM.    Final         Scheduled Meds:  apixaban  10 mg Oral BID   Followed by   Melene Muller ON 03/01/2020] apixaban  5 mg Oral BID   Chlorhexidine  Gluconate Cloth  6 each Topical Daily   hydrochlorothiazide  25 mg Oral Daily   Continuous Infusions:   LOS: 2 days     Jacquelin Hawking, MD [facility] 02/23/2020, 11:55 AM  If 7PM-7AM, please contact night-coverage [email or web address]

## 2020-02-23 NOTE — Progress Notes (Signed)
Pt ambulated in room on room air sats 96% and above. Pt tolerated well. Will continue to monitor.

## 2020-02-23 NOTE — TOC Benefit Eligibility Note (Signed)
Transition of Care [facility]) Benefit Eligibility Note    Patient Details  Name: Riley Wells MRN: [id number] Date of Birth: 03/28/1986   Medication/Dose: ELIQUIS  5 MG BID  CO-PAY- $50.00  ,  ELIQUIS  2.5 MG BID CO-PAY- $50.00  XARELTO  15 MG BID CO-PAY- $50.00 , XARELTO 20 MG DAILY  CO-PAY-$ 50.00  Covered?: Yes  Tier: 3 Drug(ALL ARE TIER 3 DRUG)  Prescription Coverage Preferred Pharmacy: [facility] [place] with Person/Company/Phone Number:: BARBARA   @ [employer] [facility] #   [phone]  Co-Pay: $50.00  Prior Approval: No  Deductible: Met(/ OUT-OF-POCKET: UNMET)  Additional Notes: ELIQUIS  10 MG :NON-FORMULARY    Memory Argue Phone Number: 02/23/2020, 10:19 AM

## 2020-02-23 NOTE — Progress Notes (Signed)
Pt's wife had questions regarding Eliquis. Pharmacist contacted and came to bedside to answer questions. Pt and wife now agreeable to take Eliquis. Heparin gtt stopped per pharmacist when Eliquis given.

## 2020-02-23 NOTE — Plan of Care (Signed)

## 2020-02-23 NOTE — Discharge Instructions (Addendum)
Riley Wells,  You were in the hospital with a big blood clot in your lungs. This was treated with removing the clot in addition to using blood thinners. Please follow-up with the pulmonologist. Because of your history, I also advise that you follow-up with a hematologist; this can be discussed at your pulmonology visit.  Information on my medicine - ELIQUIS (apixaban)  This medication education was reviewed with me or my healthcare representative as part of my discharge preparation. Why was Eliquis prescribed for you? Eliquis was prescribed to treat blood clots that may have been found in the veins of your legs (deep vein thrombosis) or in your lungs (pulmonary embolism) and to reduce the risk of them occurring again.  What do You need to know about Eliquis ? The starting dose is 10 mg (two 5 mg tablets) taken TWICE daily for the FIRST SEVEN (7) DAYS, then on May 21 the dose is reduced to ONE 5 mg tablet taken TWICE daily.  Eliquis may be taken with or without food.   Try to take the dose about the same time in the morning and in the evening. If you have difficulty swallowing the tablet whole please discuss with your pharmacist how to take the medication safely.  Take Eliquis exactly as prescribed and DO NOT stop taking Eliquis without talking to the doctor who prescribed the medication.  Stopping may increase your risk of developing a new blood clot.  Refill your prescription before you run out.  After discharge, you should have regular check-up appointments with your healthcare provider that is prescribing your Eliquis.    What do you do if you miss a dose? If a dose of ELIQUIS is not taken at the scheduled time, take it as soon as possible on the same day and twice-daily administration should be resumed. The dose should not be doubled to make up for a missed dose.  Important Safety Information A possible side effect of Eliquis is bleeding. You should call your healthcare  provider right away if you experience any of the following: ? Bleeding from an injury or your nose that does not stop. ? Unusual colored urine (red or dark brown) or unusual colored stools (red or black). ? Unusual bruising for unknown reasons. ? A serious fall or if you hit your head (even if there is no bleeding).  Some medicines may interact with Eliquis and might increase your risk of bleeding or clotting while on Eliquis. To help avoid this, consult your healthcare provider or pharmacist prior to using any new prescription or non-prescription medications, including herbals, vitamins, non-steroidal anti-inflammatory drugs (NSAIDs) and supplements.  This website has more information on Eliquis (apixaban): http://[email or web address].com/eliquis/home

## 2020-02-24 DIAGNOSIS — Z6841 Body Mass Index (BMI) 40.0 and over, adult: Secondary | ICD-10-CM

## 2020-02-24 MED ORDER — HYDROCHLOROTHIAZIDE 25 MG PO TABS: 25.0000 mg | ORAL_TABLET | Freq: Every day | ORAL | 0 refills | Status: DC

## 2020-02-24 NOTE — Progress Notes (Signed)
Rec'd call from central telemetry that pt's heart rate and increased to 130s and pt was in sinus tach. Checked on pt who is just returning from bathroom. Pt denies CP, SOB, or racing heart. VS taken and charted. Pt's HR now 102. Will to continue to monitor.

## 2020-02-24 NOTE — Plan of Care (Signed)

## 2020-02-24 NOTE — Progress Notes (Signed)
NURSING PROGRESS NOTE  Riley Wells [id number] Discharge Data: 02/24/2020 2:11 PM Attending Provider: Narda Bonds, MD [facility], No Pcp Per     Riley Wells to be D/C'd Home per MD order.  Discussed with the patient the After Visit Summary and all questions fully answered. All IV's discontinued with no bleeding noted. All belongings returned to patient for patient to take home.   Last Vital Signs:  Blood pressure (!) 144/98, pulse 94, temperature 97.8 F (36.6 C), temperature source Oral, resp. rate 16, height 6\' 3"  (1.905 m), weight (!) 168.1 kg, SpO2 99 %.  Discharge Medication List Allergies as of 02/24/2020   No Known Allergies     Medication List    STOP taking these medications   EC-Naproxen 500 MG EC tablet Generic drug: naproxen     TAKE these medications   Apixaban Starter Pack (10mg  and 5mg ) Commonly known as: ELIQUIS STARTER PACK Take as directed on package: start with two-5mg  tablets twice daily for 7 days. On day 8 (May 21), switch to one-5mg  tablet twice daily.   apixaban 5 MG Tabs tablet Commonly known as: Eliquis Take 1 tablet (5 mg total) by mouth 2 (two) times daily. Start after completing the Eliquis Starter Pack   hydrochlorothiazide 25 MG tablet Commonly known as: HYDRODIURIL Take 1 tablet (25 mg total) by mouth daily. Start taking on: Feb 25, 2020   methocarbamol 500 MG tablet Commonly known as: ROBAXIN Take 500 mg by mouth 2 (two) times daily.

## 2020-02-24 NOTE — Discharge Summary (Signed)
Physician Discharge Summary  Riley Wells ERX:[id number] DOB: [birth year] DOA: 02/21/2020  PCP: Patient, No Pcp Per  Admit date: 02/21/2020 Discharge date: 02/24/2020  Admitted From: Home Disposition: Home  Recommendations for Outpatient Follow-up:  1. Follow up with PCP in 1 week 2. Pulmonology follow-up on 5/27 3. Would likely benefit from hematology referral 4. Please obtain BMP/CBC in one week 5. Please follow up on the following pending results: None  Home Health: None Equipment/Devices: None  Discharge Condition: Stable CODE STATUS: Full code Diet recommendation: Heart healthy   Brief/Interim Summary:  Admission HPI written by Lupita Leash, MD   History of Present Illness  Riley Wells is a 34 yo M with h/o obesity who presented to [facility] ED with shortness of breath of one day duration. He reports he was taking Epsom salt baths for his back at 730 yesterday evening when he became short of breath. He got out of the bath and had difficulty sleeping that night. He returned to the bath at 4 AM this morning and again his shortness of breath worsened. His SOB was accompanied by retrosternal chest pain that was constant at the center of his chest. He did not have leg pain or swelling. He got out of the bath and sat for 45 minutes but continued to be unable to catch his breath. He does endorse a brief period of blackout. He checked his HR (133), BP (150s/110), and SpO2 (90%) which were concerning for him, and his wife drove him to the ED.   In the ED, Tmax 97.3, BP 131/78, HR 131, SpO2 98% RA. Initial workup significant for Cr 1.60, WBC 17.5. EKG showed R axis deviation, new RBBB. Lactate wnl. Trop 0.25. CTA showed acute saddle PE with evidence of R heart strain c/w PE. TTE showed LVEF 55-60%, RV enlarged, RV fx moderately impaired, mild-mod TR. He received bolus 6,000u heparing gtt and heparin 1600u/hr thereafter. At this point, [facility] PCCM was called and he was admitted  for further mgmt.   Presently, he reports his SOB and chest pain have improved with oxygen. He says that on Friday, his back pain was so bad that he laid on a heating pad for about 8 hours. No recent surgeries, trips, or otherwise limited mobility.  He denies SOB/chest pain at baseline. Denies anginal sx, and is very active playing basketball. He does endorse one DVT/PE in 2018 that was provoked by long car trip to [place]; he was on Xarelto for 6 months following.  He says he has had ongoing calf pain since January; at that point, he had gone to the ED and gotten PVLs to below the knee which did not reveal clot, though he contends they did not go high enough. He denies similar leg pain/swelling/redness at this time.      Hospital course:  Submassive PE Acute left DVT Associated right heart strain. Patient underwent successful thrombectomy and has been managed on heparin drip. On room air with normal and stable vitals. Patient transitioned to Eliquis. Pulmonology follow-up arranged. Patient would benefit from hematology referral as he has a family history of blood clots including his father who died from a PE.   Elevated blood pressure Prescribed hydrochlorothiazide. Continue to monitor as an outpatient. Titrate antihypertensives as needed.  Obesity Body mass index is 46.32 kg/m.  Discharge Diagnoses:  Principal Problem:   Acute pulmonary embolism (HCC) Active Problems:   Obesity   Acute deep vein thrombosis (DVT) [facility])    Discharge  Instructions  Discharge Instructions    Increase activity slowly   Complete by: As directed      Allergies as of 02/24/2020   No Known Allergies     Medication List    STOP taking these medications   EC-Naproxen 500 MG EC tablet Generic drug: naproxen     TAKE these medications   Apixaban Starter Pack (10mg  and 5mg ) Commonly known as: ELIQUIS STARTER PACK Take as directed on package: start with two-5mg  tablets twice daily for 7 days.  On day 8 (May 21), switch to one-5mg  tablet twice daily.   apixaban 5 MG Tabs tablet Commonly known as: Eliquis Take 1 tablet (5 mg total) by mouth 2 (two) times daily. Start after completing the Eliquis Starter Pack   hydrochlorothiazide 25 MG tablet Commonly known as: HYDRODIURIL Take 1 tablet (25 mg total) by mouth daily. Start taking on: Feb 25, 2020   methocarbamol 500 MG tablet Commonly known as: ROBAXIN Take 500 mg by mouth 2 (two) times daily.      Follow-up Information    07-15-2004, NP Follow up on 03/07/2020.   Specialty: Pulmonary Disease Why: 10:00 Contact information: [address] [place] [address] [place] [phone]          No Known Allergies  Consultations:  PCCM  Interventional radiology   Procedures/Studies: IR Angiogram Pulmonary Bilateral Selective  Result Date: 02/22/2020 INDICATION: Submassive bilateral central pulmonary emboli with pulmonary arterial hypertension, RV strain, shortness of breath, tachycardia. Hypertension. EXAM: BILATERAL PULMONARY ARTERIOGRAM LEFT PULMONARY ARTERIAL THROMBECTOMY RIGHT PULMONARY ARTERIAL THROMBECTOMY ULTRASOUND GUIDANCE FOR VASCULAR ACCESS INFERIOR VENA CAVOGRAM COMPARISON:  CTA from same day MEDICATIONS: Lidocaine 1% subcutaneous, heparin 7000 units ANESTHESIA/SEDATION: Intravenous Fentanyl [phone] and Versed 1.5mg  were administered as conscious sedation during continuous monitoring of the patient's level of consciousness and physiological / cardiorespiratory status by the radiology RN, with a total moderate sedation time of 90 minutes. TECHNIQUE: Informed written consent was obtained from the patient after a thorough discussion of the procedural risks, benefits and alternatives. All questions were addressed. Maximal Sterile Barrier Technique was utilized including caps, mask, sterile gowns, sterile gloves, sterile drape, hand hygiene and skin antiseptic. A timeout was performed prior to the initiation of  the procedure. Patency and complete compressibility of bilateral common femoral veins was confirmed with ultrasound and documented. After surgical prep and local lidocaine administration, micropuncture access to right common femoral vein achieved under ultrasound guidance, exchanged over a Bentson wire for a 7 02/24/2020 vascular sheath. Outflow right iliac vein and inferior cavogram performed. 6 French angled pigtail catheter advanced into the right pulmonary artery. Pressure measurements obtained. Pulmonary arteriogram performed. Patient was heparinized with 4000 units heparin, and ACT was maintained greater than 200 for the remainder of the procedure, with additional intermittent boluses as needed. Catheter exchanged for a 5 French vert catheter, negotiated into lower lobe branch right pulmonary artery, removed over a stiff Amplatz wire. Vascular sheath was upsized, and 24 FlowTriever device advanced into distal interlobar branch right pulmonary artery. Suction thrombectomy performed. Follow-up selective right pulmonary arteriography was obtained. The catheter was then redirected to the origin of the left pulmonary artery for left pulmonary arteriography. The 5 [place] vert catheter was coaxially advanced for additional selective left pulmonary arteriography. FlowTriever device advanced into distal left pulmonary artery. Suction mechanical thrombectomy performed. Follow-up selective left pulmonary arteriogram obtained. Catheter was retracted into the distal main pulmonary artery for final pulmonary arteriography and pressure measurements. Guidewire, catheter and sheath  were removed and hemostasis achieved with aid of 0 Prolene pursestring suture. The patient tolerated the procedure well. FLUOROSCOPY TIME:  25.8 minutes; 21458 uGym2 DAP COMPLICATIONS: None immediate. FINDINGS: Outflow right iliac venogram and inferior vena cavagram show no iliocaval thrombus or occlusion. Initial right pulmonary arterial  pressure 68/40(53) mmHg. Selective right pulmonary arteriogram shows saddle embolus across its bifurcation with partially occlusive clot extending into the intralobar branch and lower lobe segmental branches. After right pulmonary arterial selective suction thrombectomy, there is clearance of the central clot with some residual partially occlusive clot in distal interlobar and lower lobe segmental branches. Selective left pulmonary arteriogram demonstrates near occlusive thrombus in the proximal left pulmonary artery extending through its length. After left pulmonary artery selective suction thrombectomy, clearance of thrombus from the left pulmonary artery with improved branch perfusion. Final pulmonary arterial pressure 32/6(19) mmHg. IMPRESSION: 1. Negative for right iliac or inferior vena cava thrombus. 2. Bilateral central pulmonary emboli with markedly elevated pulmonary arterial pressures. 3. Good response to selective right and left pulmonary arterial thrombectomy, with near normalization of pulmonary arterial pressures. Electronically Signed   By: Corlis Leak M.D.   On: 02/22/2020 10:01   IR Angiogram Selective Each Additional Vessel  Result Date: 02/22/2020 INDICATION: Submassive bilateral central pulmonary emboli with pulmonary arterial hypertension, RV strain, shortness of breath, tachycardia. Hypertension. EXAM: BILATERAL PULMONARY ARTERIOGRAM LEFT PULMONARY ARTERIAL THROMBECTOMY RIGHT PULMONARY ARTERIAL THROMBECTOMY ULTRASOUND GUIDANCE FOR VASCULAR ACCESS INFERIOR VENA CAVOGRAM COMPARISON:  CTA from same day MEDICATIONS: Lidocaine 1% subcutaneous, heparin 7000 units ANESTHESIA/SEDATION: Intravenous Fentanyl and Versed 1.5mg  were administered as conscious sedation during continuous monitoring of the patient's level of consciousness and physiological / cardiorespiratory status by the radiology RN, with a total moderate sedation time of 90 minutes. TECHNIQUE: Informed written consent was obtained  from the patient after a thorough discussion of the procedural risks, benefits and alternatives. All questions were addressed. Maximal Sterile Barrier Technique was utilized including caps, mask, sterile gowns, sterile gloves, sterile drape, hand hygiene and skin antiseptic. A timeout was performed prior to the initiation of the procedure. Patency and complete compressibility of bilateral common femoral veins was confirmed with ultrasound and documented. After surgical prep and local lidocaine administration, micropuncture access to right common femoral vein achieved under ultrasound guidance, exchanged over a Bentson wire for a 7 [place] vascular sheath. Outflow right iliac vein and inferior cavogram performed. 6 French angled pigtail catheter advanced into the right pulmonary artery. Pressure measurements obtained. Pulmonary arteriogram performed. Patient was heparinized with 4000 units heparin, and ACT was maintained greater than 200 for the remainder of the procedure, with additional intermittent boluses as needed. Catheter exchanged for a 5 French vert catheter, negotiated into lower lobe branch right pulmonary artery, removed over a stiff Amplatz wire. Vascular sheath was upsized, and 24 [place] FlowTriever device advanced into distal interlobar branch right pulmonary artery. Suction thrombectomy performed. Follow-up selective right pulmonary arteriography was obtained. The catheter was then redirected to the origin of the left pulmonary artery for left pulmonary arteriography. The 5 [place] vert catheter was coaxially advanced for additional selective left pulmonary arteriography. FlowTriever device advanced into distal left pulmonary artery. Suction mechanical thrombectomy performed. Follow-up selective left pulmonary arteriogram obtained. Catheter was retracted into the distal main pulmonary artery for final pulmonary arteriography and pressure measurements. Guidewire, catheter and sheath were removed and  hemostasis achieved with aid of 0 Prolene pursestring suture. The patient tolerated the procedure well. FLUOROSCOPY TIME:  25.8 minutes; 21458 uGym2 DAP COMPLICATIONS: None  immediate. FINDINGS: Outflow right iliac venogram and inferior vena cavagram show no iliocaval thrombus or occlusion. Initial right pulmonary arterial pressure 68/40(53) mmHg. Selective right pulmonary arteriogram shows saddle embolus across its bifurcation with partially occlusive clot extending into the intralobar branch and lower lobe segmental branches. After right pulmonary arterial selective suction thrombectomy, there is clearance of the central clot with some residual partially occlusive clot in distal interlobar and lower lobe segmental branches. Selective left pulmonary arteriogram demonstrates near occlusive thrombus in the proximal left pulmonary artery extending through its length. After left pulmonary artery selective suction thrombectomy, clearance of thrombus from the left pulmonary artery with improved branch perfusion. Final pulmonary arterial pressure 32/6(19) mmHg. IMPRESSION: 1. Negative for right iliac or inferior vena cava thrombus. 2. Bilateral central pulmonary emboli with markedly elevated pulmonary arterial pressures. 3. Good response to selective right and left pulmonary arterial thrombectomy, with near normalization of pulmonary arterial pressures. Electronically Signed   By: Corlis Leak M.D.   On: 02/22/2020 10:01   IR Angiogram Selective Each Additional Vessel  Result Date: 02/22/2020 INDICATION: Submassive bilateral central pulmonary emboli with pulmonary arterial hypertension, RV strain, shortness of breath, tachycardia. Hypertension. EXAM: BILATERAL PULMONARY ARTERIOGRAM LEFT PULMONARY ARTERIAL THROMBECTOMY RIGHT PULMONARY ARTERIAL THROMBECTOMY ULTRASOUND GUIDANCE FOR VASCULAR ACCESS INFERIOR VENA CAVOGRAM COMPARISON:  CTA from same day MEDICATIONS: Lidocaine 1% subcutaneous, heparin 7000 units  ANESTHESIA/SEDATION: Intravenous Fentanyl and Versed 1.5mg  were administered as conscious sedation during continuous monitoring of the patient's level of consciousness and physiological / cardiorespiratory status by the radiology RN, with a total moderate sedation time of 90 minutes. TECHNIQUE: Informed written consent was obtained from the patient after a thorough discussion of the procedural risks, benefits and alternatives. All questions were addressed. Maximal Sterile Barrier Technique was utilized including caps, mask, sterile gowns, sterile gloves, sterile drape, hand hygiene and skin antiseptic. A timeout was performed prior to the initiation of the procedure. Patency and complete compressibility of bilateral common femoral veins was confirmed with ultrasound and documented. After surgical prep and local lidocaine administration, micropuncture access to right common femoral vein achieved under ultrasound guidance, exchanged over a Bentson wire for a 7 [place] vascular sheath. Outflow right iliac vein and inferior cavogram performed. 6 French angled pigtail catheter advanced into the right pulmonary artery. Pressure measurements obtained. Pulmonary arteriogram performed. Patient was heparinized with 4000 units heparin, and ACT was maintained greater than 200 for the remainder of the procedure, with additional intermittent boluses as needed. Catheter exchanged for a 5 French vert catheter, negotiated into lower lobe branch right pulmonary artery, removed over a stiff Amplatz wire. Vascular sheath was upsized, and 24 [place] FlowTriever device advanced into distal interlobar branch right pulmonary artery. Suction thrombectomy performed. Follow-up selective right pulmonary arteriography was obtained. The catheter was then redirected to the origin of the left pulmonary artery for left pulmonary arteriography. The 5 [place] vert catheter was coaxially advanced for additional selective left pulmonary  arteriography. FlowTriever device advanced into distal left pulmonary artery. Suction mechanical thrombectomy performed. Follow-up selective left pulmonary arteriogram obtained. Catheter was retracted into the distal main pulmonary artery for final pulmonary arteriography and pressure measurements. Guidewire, catheter and sheath were removed and hemostasis achieved with aid of 0 Prolene pursestring suture. The patient tolerated the procedure well. FLUOROSCOPY TIME:  25.8 minutes; 21458 uGym2 DAP COMPLICATIONS: None immediate. FINDINGS: Outflow right iliac venogram and inferior vena cavagram show no iliocaval thrombus or occlusion. Initial right pulmonary arterial pressure 68/40(53) mmHg. Selective right pulmonary arteriogram shows saddle  embolus across its bifurcation with partially occlusive clot extending into the intralobar branch and lower lobe segmental branches. After right pulmonary arterial selective suction thrombectomy, there is clearance of the central clot with some residual partially occlusive clot in distal interlobar and lower lobe segmental branches. Selective left pulmonary arteriogram demonstrates near occlusive thrombus in the proximal left pulmonary artery extending through its length. After left pulmonary artery selective suction thrombectomy, clearance of thrombus from the left pulmonary artery with improved branch perfusion. Final pulmonary arterial pressure 32/6(19) mmHg. IMPRESSION: 1. Negative for right iliac or inferior vena cava thrombus. 2. Bilateral central pulmonary emboli with markedly elevated pulmonary arterial pressures. 3. Good response to selective right and left pulmonary arterial thrombectomy, with near normalization of pulmonary arterial pressures. Electronically Signed   By: Lucrezia Europe M.D.   On: 02/22/2020 10:01   IR THROMBECT VENO MECH MOD SED  Result Date: 02/22/2020 INDICATION: Submassive bilateral central pulmonary emboli with pulmonary arterial hypertension, RV  strain, shortness of breath, tachycardia. Hypertension. EXAM: BILATERAL PULMONARY ARTERIOGRAM LEFT PULMONARY ARTERIAL THROMBECTOMY RIGHT PULMONARY ARTERIAL THROMBECTOMY ULTRASOUND GUIDANCE FOR VASCULAR ACCESS INFERIOR VENA CAVOGRAM COMPARISON:  CTA from same day MEDICATIONS: Lidocaine 1% subcutaneous, heparin 7000 units ANESTHESIA/SEDATION: Intravenous Fentanyl 52mcg and Versed 1.5mg  were administered as conscious sedation during continuous monitoring of the patient's level of consciousness and physiological / cardiorespiratory status by the radiology RN, with a total moderate sedation time of 90 minutes. TECHNIQUE: Informed written consent was obtained from the patient after a thorough discussion of the procedural risks, benefits and alternatives. All questions were addressed. Maximal Sterile Barrier Technique was utilized including caps, mask, sterile gowns, sterile gloves, sterile drape, hand hygiene and skin antiseptic. A timeout was performed prior to the initiation of the procedure. Patency and complete compressibility of bilateral common femoral veins was confirmed with ultrasound and documented. After surgical prep and local lidocaine administration, micropuncture access to right common femoral vein achieved under ultrasound guidance, exchanged over a Bentson wire for a 7 [place] vascular sheath. Outflow right iliac vein and inferior cavogram performed. 6 French angled pigtail catheter advanced into the right pulmonary artery. Pressure measurements obtained. Pulmonary arteriogram performed. Patient was heparinized with 4000 units heparin, and ACT was maintained greater than 200 for the remainder of the procedure, with additional intermittent boluses as needed. Catheter exchanged for a 5 French vert catheter, negotiated into lower lobe branch right pulmonary artery, removed over a stiff Amplatz wire. Vascular sheath was upsized, and 24 [place] FlowTriever device advanced into distal interlobar branch right  pulmonary artery. Suction thrombectomy performed. Follow-up selective right pulmonary arteriography was obtained. The catheter was then redirected to the origin of the left pulmonary artery for left pulmonary arteriography. The 5 [place] vert catheter was coaxially advanced for additional selective left pulmonary arteriography. FlowTriever device advanced into distal left pulmonary artery. Suction mechanical thrombectomy performed. Follow-up selective left pulmonary arteriogram obtained. Catheter was retracted into the distal main pulmonary artery for final pulmonary arteriography and pressure measurements. Guidewire, catheter and sheath were removed and hemostasis achieved with aid of 0 Prolene pursestring suture. The patient tolerated the procedure well. FLUOROSCOPY TIME:  25.8 minutes; 16967 uGym2 DAP COMPLICATIONS: None immediate. FINDINGS: Outflow right iliac venogram and inferior vena cavagram show no iliocaval thrombus or occlusion. Initial right pulmonary arterial pressure 68/40(53) mmHg. Selective right pulmonary arteriogram shows saddle embolus across its bifurcation with partially occlusive clot extending into the intralobar branch and lower lobe segmental branches. After right pulmonary arterial selective suction thrombectomy, there is clearance of  the central clot with some residual partially occlusive clot in distal interlobar and lower lobe segmental branches. Selective left pulmonary arteriogram demonstrates near occlusive thrombus in the proximal left pulmonary artery extending through its length. After left pulmonary artery selective suction thrombectomy, clearance of thrombus from the left pulmonary artery with improved branch perfusion. Final pulmonary arterial pressure 32/6(19) mmHg. IMPRESSION: 1. Negative for right iliac or inferior vena cava thrombus. 2. Bilateral central pulmonary emboli with markedly elevated pulmonary arterial pressures. 3. Good response to selective right and left  pulmonary arterial thrombectomy, with near normalization of pulmonary arterial pressures. Electronically Signed   By: Corlis Leak M.D.   On: 02/22/2020 10:01   IR US Guide Vasc Access Right  Result Date: 02/22/2020 INDICATION: Submassive bilateral central pulmonary emboli with pulmonary arterial hypertension, RV strain, shortness of breath, tachycardia. Hypertension. EXAM: BILATERAL PULMONARY ARTERIOGRAM LEFT PULMONARY ARTERIAL THROMBECTOMY RIGHT PULMONARY ARTERIAL THROMBECTOMY ULTRASOUND GUIDANCE FOR VASCULAR ACCESS INFERIOR VENA CAVOGRAM COMPARISON:  CTA from same day MEDICATIONS: Lidocaine 1% subcutaneous, heparin 7000 units ANESTHESIA/SEDATION: Intravenous Fentanyl and Versed 1.5mg  were administered as conscious sedation during continuous monitoring of the patient's level of consciousness and physiological / cardiorespiratory status by the radiology RN, with a total moderate sedation time of 90 minutes. TECHNIQUE: Informed written consent was obtained from the patient after a thorough discussion of the procedural risks, benefits and alternatives. All questions were addressed. Maximal Sterile Barrier Technique was utilized including caps, mask, sterile gowns, sterile gloves, sterile drape, hand hygiene and skin antiseptic. A timeout was performed prior to the initiation of the procedure. Patency and complete compressibility of bilateral common femoral veins was confirmed with ultrasound and documented. After surgical prep and local lidocaine administration, micropuncture access to right common femoral vein achieved under ultrasound guidance, exchanged over a Bentson wire for a 7 [place] vascular sheath. Outflow right iliac vein and inferior cavogram performed. 6 French angled pigtail catheter advanced into the right pulmonary artery. Pressure measurements obtained. Pulmonary arteriogram performed. Patient was heparinized with 4000 units heparin, and ACT was maintained greater than 200 for the remainder of  the procedure, with additional intermittent boluses as needed. Catheter exchanged for a 5 French vert catheter, negotiated into lower lobe branch right pulmonary artery, removed over a stiff Amplatz wire. Vascular sheath was upsized, and 24 [place] FlowTriever device advanced into distal interlobar branch right pulmonary artery. Suction thrombectomy performed. Follow-up selective right pulmonary arteriography was obtained. The catheter was then redirected to the origin of the left pulmonary artery for left pulmonary arteriography. The 5 [place] vert catheter was coaxially advanced for additional selective left pulmonary arteriography. FlowTriever device advanced into distal left pulmonary artery. Suction mechanical thrombectomy performed. Follow-up selective left pulmonary arteriogram obtained. Catheter was retracted into the distal main pulmonary artery for final pulmonary arteriography and pressure measurements. Guidewire, catheter and sheath were removed and hemostasis achieved with aid of 0 Prolene pursestring suture. The patient tolerated the procedure well. FLUOROSCOPY TIME:  25.8 minutes; [id number] uGym2 DAP COMPLICATIONS: None immediate. FINDINGS: Outflow right iliac venogram and inferior vena cavagram show no iliocaval thrombus or occlusion. Initial right pulmonary arterial pressure 68/40(53) mmHg. Selective right pulmonary arteriogram shows saddle embolus across its bifurcation with partially occlusive clot extending into the intralobar branch and lower lobe segmental branches. After right pulmonary arterial selective suction thrombectomy, there is clearance of the central clot with some residual partially occlusive clot in distal interlobar and lower lobe segmental branches. Selective left pulmonary arteriogram demonstrates near occlusive thrombus in the proximal left  pulmonary artery extending through its length. After left pulmonary artery selective suction thrombectomy, clearance of thrombus from the left  pulmonary artery with improved branch perfusion. Final pulmonary arterial pressure 32/6(19) mmHg. IMPRESSION: 1. Negative for right iliac or inferior vena cava thrombus. 2. Bilateral central pulmonary emboli with markedly elevated pulmonary arterial pressures. 3. Good response to selective right and left pulmonary arterial thrombectomy, with near normalization of pulmonary arterial pressures. Electronically Signed   By: Corlis Leak  Hassell M.D.   On: 02/22/2020 10:01   VAS US LOWER EXTREMITY VENOUS (DVT)  Result Date: 02/22/2020  Lower Venous DVT Study Indications: Pulmonary embolism.  Risk Factors: Confirmed PE thrombectomy. Limitations: Body habitus, bandages and patient unable to tolerate compression in groin. Comparison Study: No prior study on file for comparison Performing Technologist: Sherren Kernsandace Kanady RVS  Examination Guidelines: A complete evaluation includes B-mode imaging, spectral Doppler, color Doppler, and power Doppler as needed of all accessible portions of each vessel. Bilateral testing is considered an integral part of a complete examination. Limited examinations for reoccurring indications may be performed as noted. The reflux portion of the exam is performed with the patient in reverse Trendelenburg.  +-----+---------------+---------+-----------+----------+--------------+ RIGHTCompressibilityPhasicitySpontaneityPropertiesThrombus Aging +-----+---------------+---------+-----------+----------+--------------+ CFV                                               bandages       +-----+---------------+---------+-----------+----------+--------------+   +---------+---------------+---------+-----------+----------+--------------+ LEFT     CompressibilityPhasicitySpontaneityPropertiesThrombus Aging +---------+---------------+---------+-----------+----------+--------------+ CFV                     Yes      Yes                                  +---------+---------------+---------+-----------+----------+--------------+ FV Prox  Full           Yes      Yes                                 +---------+---------------+---------+-----------+----------+--------------+ FV Mid   Full                                                        +---------+---------------+---------+-----------+----------+--------------+ FV DistalFull                                                        +---------+---------------+---------+-----------+----------+--------------+ PFV      Full                                                        +---------+---------------+---------+-----------+----------+--------------+ POP      None           No       No  Acute          +---------+---------------+---------+-----------+----------+--------------+ PTV      None                                         Acute          +---------+---------------+---------+-----------+----------+--------------+ PERO     None                                         Acute          +---------+---------------+---------+-----------+----------+--------------+ Gastroc  Full                                                        +---------+---------------+---------+-----------+----------+--------------+     Summary: RIGHT: - Unable to visualize CFV secondary to bandages. FV is WNL  LEFT: - Findings consistent with acute deep vein thrombosis involving the left popliteal vein, left posterior tibial veins, and left peroneal veins.  *See table(s) above for measurements and observations. Electronically signed by Lemar Livings MD on 02/22/2020 at 5:40:40 PM.    Final       BILATERAL PA CATHETER THROMBECTOMY (02/21/2020)  BILATERAL LE VENOUS DUPLEX (02/22/2020) Summary:  RIGHT:  - Unable to visualize CFV secondary to bandages. FV is WNL    LEFT:  - Findings consistent with acute deep vein thrombosis involving the left  popliteal vein, left  posterior tibial veins, and left peroneal veins.    Subjective: No dyspnea, chest pain, palpitations.  Discharge Exam: Vitals:   02/24/20 0758 02/24/20 1102  BP: 127/88 (!) 144/98  Pulse: 80 94  Resp: 16   Temp: 97.8 F (36.6 C)   SpO2: 99%    Vitals:   02/23/20 2059 02/24/20 0135 02/24/20 0758 02/24/20 1102  BP: (!) 132/98 (!) 150/99 127/88 (!) 144/98  Pulse: (!) 101 (!) 102 80 94  Resp:  17 16   Temp: 98 F (36.7 C)  97.8 F (36.6 C)   TempSrc: Oral  Oral   SpO2: 98% 98% 99%   Weight:      Height:        General: Pt is alert, awake, not in acute distress Cardiovascular: RRR, S1/S2 +, no rubs, no gallops Respiratory: CTA bilaterally, no wheezing, no rhonchi Abdominal: Soft, NT, ND, bowel sounds + Extremities: no edema, no cyanosis    The results of significant diagnostics from this hospitalization (including imaging, microbiology, ancillary and laboratory) are listed below for reference.     Microbiology: Recent Results (from the past 240 hour(s))  MRSA PCR Screening     Status: None   Collection Time: 02/21/20 12:02 PM   Specimen: Nasal Mucosa; Nasopharyngeal  Result Value Ref Range Status   MRSA by PCR NEGATIVE NEGATIVE Final    Comment:        The GeneXpert MRSA Assay (FDA approved for NASAL specimens only), is one component of a comprehensive MRSA colonization surveillance program. It is not intended to diagnose MRSA infection nor to guide or monitor treatment for MRSA infections. Performed at [facility], [address] [place], [place] [address]   SARS Coronavirus 2 by  RT PCR (hospital order, performed in [facility] hospital lab) Nasopharyngeal Nasopharyngeal Swab     Status: None   Collection Time: 02/21/20 12:47 PM   Specimen: Nasopharyngeal Swab  Result Value Ref Range Status   SARS Coronavirus 2 NEGATIVE NEGATIVE Final    Comment: (NOTE) SARS-CoV-2 target nucleic acids are NOT DETECTED. The SARS-CoV-2 RNA is generally detectable  in upper and lower respiratory specimens during the acute phase of infection. The lowest concentration of SARS-CoV-2 viral copies this assay can detect is 250 copies / mL. A negative result does not preclude SARS-CoV-2 infection and should not be used as the sole basis for treatment or other patient management decisions.  A negative result may occur with improper specimen collection / handling, submission of specimen other than nasopharyngeal swab, presence of viral mutation(s) within the areas targeted by this assay, and inadequate number of viral copies (<250 copies / mL). A negative result must be combined with clinical observations, patient history, and epidemiological information. Fact Sheet for Patients:   BoilerBrush.com.cy Fact Sheet for Healthcare Providers: https://pope.com/ This test is not yet approved or cleared  by the [place] FDA and has been authorized for detection and/or diagnosis of SARS-CoV-2 by FDA under an Emergency Use Authorization (EUA).  This EUA will remain in effect (meaning this test can be used) for the duration of the COVID-19 declaration under Section 564(b)(1) of the Act, 21 U.S.C. section 360bbb-3(b)(1), unless the authorization is terminated or revoked sooner. Performed at [facility], [address] [place], [place] [address]      Labs: BNP (last 3 results) Recent Labs    02/21/20 1317  BNP 205.9*   Basic Metabolic Panel: Recent Labs  Lab 02/21/20 1317 02/22/20 0247 02/23/20 0429  NA 140 141 137  K 4.8 4.2 3.9  CL 109 111 107  CO2 17* 20* 22  GLUCOSE 131* 106* 113*  BUN 33* 27* 16  CREATININE 1.29* 1.24 1.09  CALCIUM 9.1 9.0 9.0  MG 2.4 2.2  --   PHOS 5.1* 4.2  --    Liver Function Tests: Recent Labs  Lab 02/21/20 1317 02/23/20 0429  AST 76* 36  ALT 123* 79*  ALKPHOS 55 45  BILITOT 0.7 1.0  PROT 7.4 6.7  ALBUMIN 4.0 3.7   No results for input(s): LIPASE,  AMYLASE in the last 168 hours. No results for input(s): AMMONIA in the last 168 hours. CBC: Recent Labs  Lab 02/21/20 1317 02/22/20 0247  WBC 13.1* 11.1*  NEUTROABS 6.8  --   HGB 14.6 13.7  HCT 44.9 43.0  MCV 91.4 91.1  PLT 235 197   Cardiac Enzymes: No results for input(s): CKTOTAL, CKMB, CKMBINDEX, TROPONINI in the last 168 hours. BNP: Invalid input(s): POCBNP CBG: Recent Labs  Lab 02/21/20 1531 02/21/20 1916  GLUCAP 117* 85   D-Dimer No results for input(s): DDIMER in the last 72 hours. Hgb A1c No results for input(s): HGBA1C in the last 72 hours. Lipid Profile No results for input(s): CHOL, HDL, LDLCALC, TRIG, CHOLHDL, LDLDIRECT in the last 72 hours. Thyroid function studies No results for input(s): TSH, T4TOTAL, T3FREE, THYROIDAB in the last 72 hours.  Invalid input(s): FREET3 Anemia work up No results for input(s): VITAMINB12, FOLATE, FERRITIN, TIBC, IRON, RETICCTPCT in the last 72 hours. Urinalysis No results found for: COLORURINE, APPEARANCEUR, LABSPEC, PHURINE, GLUCOSEU, HGBUR, BILIRUBINUR, KETONESUR, PROTEINUR, UROBILINOGEN, NITRITE, LEUKOCYTESUR Sepsis Labs Invalid input(s): PROCALCITONIN,  WBC,  LACTICIDVEN Microbiology Recent Results (from the past 240 hour(s))  MRSA PCR Screening     Status: None   Collection Time: 02/21/20 12:02 PM   Specimen: Nasal Mucosa; Nasopharyngeal  Result Value Ref Range Status   MRSA by PCR NEGATIVE NEGATIVE Final    Comment:        The GeneXpert MRSA Assay (FDA approved for NASAL specimens only), is one component of a comprehensive MRSA colonization surveillance program. It is not intended to diagnose MRSA infection nor to guide or monitor treatment for MRSA infections. Performed at [facility], [address] [place], [place] [address]   SARS Coronavirus 2 by RT PCR (hospital order, performed in [facility] hospital lab) Nasopharyngeal Nasopharyngeal Swab     Status: None   Collection Time: 02/21/20 12:47  PM   Specimen: Nasopharyngeal Swab  Result Value Ref Range Status   SARS Coronavirus 2 NEGATIVE NEGATIVE Final    Comment: (NOTE) SARS-CoV-2 target nucleic acids are NOT DETECTED. The SARS-CoV-2 RNA is generally detectable in upper and lower respiratory specimens during the acute phase of infection. The lowest concentration of SARS-CoV-2 viral copies this assay can detect is 250 copies / mL. A negative result does not preclude SARS-CoV-2 infection and should not be used as the sole basis for treatment or other patient management decisions.  A negative result may occur with improper specimen collection / handling, submission of specimen other than nasopharyngeal swab, presence of viral mutation(s) within the areas targeted by this assay, and inadequate number of viral copies (<250 copies / mL). A negative result must be combined with clinical observations, patient history, and epidemiological information. Fact Sheet for Patients:   BoilerBrush.com.cy Fact Sheet for Healthcare Providers: https://pope.com/ This test is not yet approved or cleared  by the [place] FDA and has been authorized for detection and/or diagnosis of SARS-CoV-2 by FDA under an Emergency Use Authorization (EUA).  This EUA will remain in effect (meaning this test can be used) for the duration of the COVID-19 declaration under Section 564(b)(1) of the Act, 21 U.S.C. section 360bbb-3(b)(1), unless the authorization is terminated or revoked sooner. Performed at [facility], [address] [place], [place] [address]      Time coordinating discharge: 35 minutes  SIGNED:   Jacquelin Hawking, MD [facility] 02/24/2020, 11:54 AM

## 2020-03-06 NOTE — Progress Notes (Signed)
@Patient  ID: , male    DOB: 02-14-1986, 34 y.o.   MRN: [id number]  Chief Complaint  Patient presents with  . Follow-up    hosp follow up - PE     Referring provider: No ref. provider found  HPI:  34 year old male current everyday smoker initially consulted with our practice on 02/21/2020 when admitted for a large saddle pulmonary embolism  PMH: Obesity Smoker/ Smoking History: Current Smoker.  Smoking 2 cigarettes a day Maintenance:   Pt of: Needs outpatient pulmonologist  03/07/2020  - Visit   34 year old male initially consulted with our practice on 02/21/2020 when admitted for pulmonary embolism.  Patient was discharged on 02/24/2020.  Summary an excerpt from discharge summaries listed below:  Admit date: 02/21/2020 Discharge date: 02/24/2020  Admitted From: Home Disposition: Home  Recommendations for Outpatient Follow-up:  1. Follow up with PCP in 1 week 2. Pulmonology follow-up on 5/27 3. Would likely benefit from hematology referral 4. Please obtain BMP/CBC in one week 5. Please follow up on the following pending results: None  Home Health: None Equipment/Devices: None  Discharge Condition: Stable CODE STATUS: Full code Diet recommendation: Heart healthy   Brief/Interim Summary:  Admission HPI written by 6/27, MD  History of Present Illness  [name] is a 34 yo M with h/o obesity who presented to [facility] ED with shortness of breath of one day duration. He reports he was taking Epsom salt baths for his back at 730 yesterday evening when he became short of breath. He got out of the bath and had difficulty sleeping that night. He returned to the bath at 4 AM this morning and again his shortness of breath worsened. His SOB was accompanied by retrosternal chest pain that was constant at the center of his chest. He did not have leg pain or swelling. He got out of the bath and sat for 45 minutes but continued to be unable to  catch his breath. He does endorse a brief period of blackout. He checked his HR (133), BP (150s/110), and SpO2 (90%) which were concerning for him, and his wife drove him to the ED.   In the ED, Tmax 97.3, BP 131/78, HR 131, SpO2 98% RA. Initial workup significant for Cr 1.60, WBC 17.5. EKG showed R axis deviation, new RBBB. Lactate wnl. Trop 0.25. CTA showed acute saddle PE with evidence of R heart strain c/w PE. TTE showed LVEF 55-60%, RV enlarged, RV fx moderately impaired, mild-mod TR. He received bolus 6,000u heparing gtt and heparin 1600u/hr thereafter. At this point, [facility] PCCM was called and he was admitted for further mgmt.   Presently, he reports his SOB and chest pain have improved with oxygen. He says that on Friday, his back pain was so bad that he laid on a heating pad for about 8 hours. No recent surgeries, trips, or otherwise limited mobility.  He denies SOB/chest pain at baseline. Denies anginal sx, and is very active playing basketball. He does endorse one DVT/PE in 2018 that was provoked by long car trip to 2019; he was on Xarelto for 6 months following.  He says he has had ongoing calf pain since January; at that point, he had gone to the ED and gotten PVLs to below the knee which did not reveal clot, though he contends they did not go high enough. He denies similar leg pain/swelling/redness at this time.   Hospital course:  Submassive PE Acute left DVT Associated right heart strain.  Patient underwent successful thrombectomy and has been managed on heparin drip. On room air with normal and stable vitals. Patient transitioned to Eliquis. Pulmonology follow-up arranged. Patient would benefit from hematology referral as he has a family history of blood clots including his father who died from a PE.   Elevated blood pressure Prescribed hydrochlorothiazide. Continue to monitor as an outpatient. Titrate antihypertensives as needed.  Obesity Body mass index is 46.32  kg/m.   Patient presenting today after being discharged from the hospital.  He reports that his breathing has improved.  He is still working on stopping smoking.  He is maintained on Eliquis.  He has not had any episodes of hemoptysis or nosebleeds.  He is getting established with a primary care provider in the [facility].  He has not been referred to hematology. Pt is high risk for CTEPH.   There is a suspicion of obstructive sleep apnea.  Patient reports that he has apneas when he sleeps.  He is also been told that he snores.  STOP BANG questionnaire  Snoring? yes Tiredness? yes Observed apneas? yes Elevated blood pressure? yes BMI greater than 35? Yes  Age greater than 50? No  Neck circumference greater than 40 cm? yes Male gender? yes  Scoring:  One-point is signed for each.   0-2 equals low risk, 3-4 equals intermediate risk, those with 5 or greater high risk for having obstructive sleep apnea  Score: 7   Questionaires / Pulmonary Flowsheets:   ACT:  No flowsheet data found.  MMRC: No flowsheet data found.  Epworth:  No flowsheet data found.  Tests:   02/22/2020-IR angiogram-negative for right iliac or inferior vena cava thrombus, bilateral central pulmonary emboli with markedly elevated pulmonary arterial pressures, good response to selective right and left pulmonary artery thrombectomy with near normalization and pulmonary artery pressures  02/22/2020-lower extremity Doppler-unable to visualize right CFV secondary to bandages, left findings consistent with acute DVT involving left popliteal vein   FENO:  No results found for: NITRICOXIDE  PFT: No flowsheet data found.  WALK:  SIX MIN WALK 03/07/2020  Supplimental Oxygen during Test? (L/min) No  Tech Comments: tolerated well, walked slow to moderate pace, no desat or shortness of breath    Imaging: IR Angiogram Pulmonary Bilateral Selective  Result Date: 02/22/2020 INDICATION: Submassive  bilateral central pulmonary emboli with pulmonary arterial hypertension, RV strain, shortness of breath, tachycardia. Hypertension. EXAM: BILATERAL PULMONARY ARTERIOGRAM LEFT PULMONARY ARTERIAL THROMBECTOMY RIGHT PULMONARY ARTERIAL THROMBECTOMY ULTRASOUND GUIDANCE FOR VASCULAR ACCESS INFERIOR VENA CAVOGRAM COMPARISON:  CTA from same day MEDICATIONS: Lidocaine 1% subcutaneous, heparin 7000 units ANESTHESIA/SEDATION: Intravenous Fentanyl and Versed 1.5mg  were administered as conscious sedation during continuous monitoring of the patient's level of consciousness and physiological / cardiorespiratory status by the radiology RN, with a total moderate sedation time of 90 minutes. TECHNIQUE: Informed written consent was obtained from the patient after a thorough discussion of the procedural risks, benefits and alternatives. All questions were addressed. Maximal Sterile Barrier Technique was utilized including caps, mask, sterile gowns, sterile gloves, sterile drape, hand hygiene and skin antiseptic. A timeout was performed prior to the initiation of the procedure. Patency and complete compressibility of bilateral common femoral veins was confirmed with ultrasound and documented. After surgical prep and local lidocaine administration, micropuncture access to right common femoral vein achieved under ultrasound guidance, exchanged over a Bentson wire for a 7 [place] vascular sheath. Outflow right iliac vein and inferior cavogram performed. 6 French angled pigtail catheter advanced into  the right pulmonary artery. Pressure measurements obtained. Pulmonary arteriogram performed. Patient was heparinized with 4000 units heparin, and ACT was maintained greater than 200 for the remainder of the procedure, with additional intermittent boluses as needed. Catheter exchanged for a 5 French vert catheter, negotiated into lower lobe branch right pulmonary artery, removed over a stiff Amplatz wire. Vascular sheath was upsized, and 24  [place] FlowTriever device advanced into distal interlobar branch right pulmonary artery. Suction thrombectomy performed. Follow-up selective right pulmonary arteriography was obtained. The catheter was then redirected to the origin of the left pulmonary artery for left pulmonary arteriography. The 5 [place] vert catheter was coaxially advanced for additional selective left pulmonary arteriography. FlowTriever device advanced into distal left pulmonary artery. Suction mechanical thrombectomy performed. Follow-up selective left pulmonary arteriogram obtained. Catheter was retracted into the distal main pulmonary artery for final pulmonary arteriography and pressure measurements. Guidewire, catheter and sheath were removed and hemostasis achieved with aid of 0 Prolene pursestring suture. The patient tolerated the procedure well. FLUOROSCOPY TIME:  25.8 minutes; 21458 uGym2 DAP COMPLICATIONS: None immediate. FINDINGS: Outflow right iliac venogram and inferior vena cavagram show no iliocaval thrombus or occlusion. Initial right pulmonary arterial pressure 68/40(53) mmHg. Selective right pulmonary arteriogram shows saddle embolus across its bifurcation with partially occlusive clot extending into the intralobar branch and lower lobe segmental branches. After right pulmonary arterial selective suction thrombectomy, there is clearance of the central clot with some residual partially occlusive clot in distal interlobar and lower lobe segmental branches. Selective left pulmonary arteriogram demonstrates near occlusive thrombus in the proximal left pulmonary artery extending through its length. After left pulmonary artery selective suction thrombectomy, clearance of thrombus from the left pulmonary artery with improved branch perfusion. Final pulmonary arterial pressure 32/6(19) mmHg. IMPRESSION: 1. Negative for right iliac or inferior vena cava thrombus. 2. Bilateral central pulmonary emboli with markedly elevated pulmonary  arterial pressures. 3. Good response to selective right and left pulmonary arterial thrombectomy, with near normalization of pulmonary arterial pressures. Electronically Signed   By: Corlis Leak M.D.   On: 02/22/2020 10:01   IR Angiogram Selective Each Additional Vessel  Result Date: 02/22/2020 INDICATION: Submassive bilateral central pulmonary emboli with pulmonary arterial hypertension, RV strain, shortness of breath, tachycardia. Hypertension. EXAM: BILATERAL PULMONARY ARTERIOGRAM LEFT PULMONARY ARTERIAL THROMBECTOMY RIGHT PULMONARY ARTERIAL THROMBECTOMY ULTRASOUND GUIDANCE FOR VASCULAR ACCESS INFERIOR VENA CAVOGRAM COMPARISON:  CTA from same day MEDICATIONS: Lidocaine 1% subcutaneous, heparin 7000 units ANESTHESIA/SEDATION: Intravenous Fentanyl and Versed 1.5mg  were administered as conscious sedation during continuous monitoring of the patient's level of consciousness and physiological / cardiorespiratory status by the radiology RN, with a total moderate sedation time of 90 minutes. TECHNIQUE: Informed written consent was obtained from the patient after a thorough discussion of the procedural risks, benefits and alternatives. All questions were addressed. Maximal Sterile Barrier Technique was utilized including caps, mask, sterile gowns, sterile gloves, sterile drape, hand hygiene and skin antiseptic. A timeout was performed prior to the initiation of the procedure. Patency and complete compressibility of bilateral common femoral veins was confirmed with ultrasound and documented. After surgical prep and local lidocaine administration, micropuncture access to right common femoral vein achieved under ultrasound guidance, exchanged over a Bentson wire for a 7 [place] vascular sheath. Outflow right iliac vein and inferior cavogram performed. 6 French angled pigtail catheter advanced into the right pulmonary artery. Pressure measurements obtained. Pulmonary arteriogram performed. Patient was heparinized with  4000 units heparin, and ACT was maintained greater than 200 for the remainder of  the procedure, with additional intermittent boluses as needed. Catheter exchanged for a 5 French vert catheter, negotiated into lower lobe branch right pulmonary artery, removed over a stiff Amplatz wire. Vascular sheath was upsized, and 24 [place] FlowTriever device advanced into distal interlobar branch right pulmonary artery. Suction thrombectomy performed. Follow-up selective right pulmonary arteriography was obtained. The catheter was then redirected to the origin of the left pulmonary artery for left pulmonary arteriography. The 5 [place] vert catheter was coaxially advanced for additional selective left pulmonary arteriography. FlowTriever device advanced into distal left pulmonary artery. Suction mechanical thrombectomy performed. Follow-up selective left pulmonary arteriogram obtained. Catheter was retracted into the distal main pulmonary artery for final pulmonary arteriography and pressure measurements. Guidewire, catheter and sheath were removed and hemostasis achieved with aid of 0 Prolene pursestring suture. The patient tolerated the procedure well. FLUOROSCOPY TIME:  25.8 minutes; 54008 uGym2 DAP COMPLICATIONS: None immediate. FINDINGS: Outflow right iliac venogram and inferior vena cavagram show no iliocaval thrombus or occlusion. Initial right pulmonary arterial pressure 68/40(53) mmHg. Selective right pulmonary arteriogram shows saddle embolus across its bifurcation with partially occlusive clot extending into the intralobar branch and lower lobe segmental branches. After right pulmonary arterial selective suction thrombectomy, there is clearance of the central clot with some residual partially occlusive clot in distal interlobar and lower lobe segmental branches. Selective left pulmonary arteriogram demonstrates near occlusive thrombus in the proximal left pulmonary artery extending through its length. After left  pulmonary artery selective suction thrombectomy, clearance of thrombus from the left pulmonary artery with improved branch perfusion. Final pulmonary arterial pressure 32/6(19) mmHg. IMPRESSION: 1. Negative for right iliac or inferior vena cava thrombus. 2. Bilateral central pulmonary emboli with markedly elevated pulmonary arterial pressures. 3. Good response to selective right and left pulmonary arterial thrombectomy, with near normalization of pulmonary arterial pressures. Electronically Signed   By: Lucrezia Europe M.D.   On: 02/22/2020 10:01   IR Angiogram Selective Each Additional Vessel  Result Date: 02/22/2020 INDICATION: Submassive bilateral central pulmonary emboli with pulmonary arterial hypertension, RV strain, shortness of breath, tachycardia. Hypertension. EXAM: BILATERAL PULMONARY ARTERIOGRAM LEFT PULMONARY ARTERIAL THROMBECTOMY RIGHT PULMONARY ARTERIAL THROMBECTOMY ULTRASOUND GUIDANCE FOR VASCULAR ACCESS INFERIOR VENA CAVOGRAM COMPARISON:  CTA from same day MEDICATIONS: Lidocaine 1% subcutaneous, heparin 7000 units ANESTHESIA/SEDATION: Intravenous Fentanyl 19mcg and Versed 1.5mg  were administered as conscious sedation during continuous monitoring of the patient's level of consciousness and physiological / cardiorespiratory status by the radiology RN, with a total moderate sedation time of 90 minutes. TECHNIQUE: Informed written consent was obtained from the patient after a thorough discussion of the procedural risks, benefits and alternatives. All questions were addressed. Maximal Sterile Barrier Technique was utilized including caps, mask, sterile gowns, sterile gloves, sterile drape, hand hygiene and skin antiseptic. A timeout was performed prior to the initiation of the procedure. Patency and complete compressibility of bilateral common femoral veins was confirmed with ultrasound and documented. After surgical prep and local lidocaine administration, micropuncture access to right common femoral  vein achieved under ultrasound guidance, exchanged over a Bentson wire for a 7 [place] vascular sheath. Outflow right iliac vein and inferior cavogram performed. 6 French angled pigtail catheter advanced into the right pulmonary artery. Pressure measurements obtained. Pulmonary arteriogram performed. Patient was heparinized with 4000 units heparin, and ACT was maintained greater than 200 for the remainder of the procedure, with additional intermittent boluses as needed. Catheter exchanged for a 5 French vert catheter, negotiated into lower lobe branch right pulmonary artery, removed over a stiff Amplatz  wire. Vascular sheath was upsized, and 24 [place] FlowTriever device advanced into distal interlobar branch right pulmonary artery. Suction thrombectomy performed. Follow-up selective right pulmonary arteriography was obtained. The catheter was then redirected to the origin of the left pulmonary artery for left pulmonary arteriography. The 5 [place] vert catheter was coaxially advanced for additional selective left pulmonary arteriography. FlowTriever device advanced into distal left pulmonary artery. Suction mechanical thrombectomy performed. Follow-up selective left pulmonary arteriogram obtained. Catheter was retracted into the distal main pulmonary artery for final pulmonary arteriography and pressure measurements. Guidewire, catheter and sheath were removed and hemostasis achieved with aid of 0 Prolene pursestring suture. The patient tolerated the procedure well. FLUOROSCOPY TIME:  25.8 minutes; 21458 uGym2 DAP COMPLICATIONS: None immediate. FINDINGS: Outflow right iliac venogram and inferior vena cavagram show no iliocaval thrombus or occlusion. Initial right pulmonary arterial pressure 68/40(53) mmHg. Selective right pulmonary arteriogram shows saddle embolus across its bifurcation with partially occlusive clot extending into the intralobar branch and lower lobe segmental branches. After right pulmonary arterial  selective suction thrombectomy, there is clearance of the central clot with some residual partially occlusive clot in distal interlobar and lower lobe segmental branches. Selective left pulmonary arteriogram demonstrates near occlusive thrombus in the proximal left pulmonary artery extending through its length. After left pulmonary artery selective suction thrombectomy, clearance of thrombus from the left pulmonary artery with improved branch perfusion. Final pulmonary arterial pressure 32/6(19) mmHg. IMPRESSION: 1. Negative for right iliac or inferior vena cava thrombus. 2. Bilateral central pulmonary emboli with markedly elevated pulmonary arterial pressures. 3. Good response to selective right and left pulmonary arterial thrombectomy, with near normalization of pulmonary arterial pressures. Electronically Signed   By: Corlis Leak M.D.   On: 02/22/2020 10:01   IR THROMBECT VENO MECH MOD SED  Result Date: 02/22/2020 INDICATION: Submassive bilateral central pulmonary emboli with pulmonary arterial hypertension, RV strain, shortness of breath, tachycardia. Hypertension. EXAM: BILATERAL PULMONARY ARTERIOGRAM LEFT PULMONARY ARTERIAL THROMBECTOMY RIGHT PULMONARY ARTERIAL THROMBECTOMY ULTRASOUND GUIDANCE FOR VASCULAR ACCESS INFERIOR VENA CAVOGRAM COMPARISON:  CTA from same day MEDICATIONS: Lidocaine 1% subcutaneous, heparin 7000 units ANESTHESIA/SEDATION: Intravenous Fentanyl and Versed 1.5mg  were administered as conscious sedation during continuous monitoring of the patient's level of consciousness and physiological / cardiorespiratory status by the radiology RN, with a total moderate sedation time of 90 minutes. TECHNIQUE: Informed written consent was obtained from the patient after a thorough discussion of the procedural risks, benefits and alternatives. All questions were addressed. Maximal Sterile Barrier Technique was utilized including caps, mask, sterile gowns, sterile gloves, sterile drape, hand hygiene  and skin antiseptic. A timeout was performed prior to the initiation of the procedure. Patency and complete compressibility of bilateral common femoral veins was confirmed with ultrasound and documented. After surgical prep and local lidocaine administration, micropuncture access to right common femoral vein achieved under ultrasound guidance, exchanged over a Bentson wire for a 7 [place] vascular sheath. Outflow right iliac vein and inferior cavogram performed. 6 French angled pigtail catheter advanced into the right pulmonary artery. Pressure measurements obtained. Pulmonary arteriogram performed. Patient was heparinized with 4000 units heparin, and ACT was maintained greater than 200 for the remainder of the procedure, with additional intermittent boluses as needed. Catheter exchanged for a 5 French vert catheter, negotiated into lower lobe branch right pulmonary artery, removed over a stiff Amplatz wire. Vascular sheath was upsized, and 24 [place] FlowTriever device advanced into distal interlobar branch right pulmonary artery. Suction thrombectomy performed. Follow-up selective right pulmonary arteriography was obtained. The  catheter was then redirected to the origin of the left pulmonary artery for left pulmonary arteriography. The 5 [place] vert catheter was coaxially advanced for additional selective left pulmonary arteriography. FlowTriever device advanced into distal left pulmonary artery. Suction mechanical thrombectomy performed. Follow-up selective left pulmonary arteriogram obtained. Catheter was retracted into the distal main pulmonary artery for final pulmonary arteriography and pressure measurements. Guidewire, catheter and sheath were removed and hemostasis achieved with aid of 0 Prolene pursestring suture. The patient tolerated the procedure well. FLUOROSCOPY TIME:  25.8 minutes; 21458 uGym2 DAP COMPLICATIONS: None immediate. FINDINGS: Outflow right iliac venogram and inferior vena cavagram show no  iliocaval thrombus or occlusion. Initial right pulmonary arterial pressure 68/40(53) mmHg. Selective right pulmonary arteriogram shows saddle embolus across its bifurcation with partially occlusive clot extending into the intralobar branch and lower lobe segmental branches. After right pulmonary arterial selective suction thrombectomy, there is clearance of the central clot with some residual partially occlusive clot in distal interlobar and lower lobe segmental branches. Selective left pulmonary arteriogram demonstrates near occlusive thrombus in the proximal left pulmonary artery extending through its length. After left pulmonary artery selective suction thrombectomy, clearance of thrombus from the left pulmonary artery with improved branch perfusion. Final pulmonary arterial pressure 32/6(19) mmHg. IMPRESSION: 1. Negative for right iliac or inferior vena cava thrombus. 2. Bilateral central pulmonary emboli with markedly elevated pulmonary arterial pressures. 3. Good response to selective right and left pulmonary arterial thrombectomy, with near normalization of pulmonary arterial pressures. Electronically Signed   By: Corlis Leak M.D.   On: 02/22/2020 10:01   IR US Guide Vasc Access Right  Result Date: 02/22/2020 INDICATION: Submassive bilateral central pulmonary emboli with pulmonary arterial hypertension, RV strain, shortness of breath, tachycardia. Hypertension. EXAM: BILATERAL PULMONARY ARTERIOGRAM LEFT PULMONARY ARTERIAL THROMBECTOMY RIGHT PULMONARY ARTERIAL THROMBECTOMY ULTRASOUND GUIDANCE FOR VASCULAR ACCESS INFERIOR VENA CAVOGRAM COMPARISON:  CTA from same day MEDICATIONS: Lidocaine 1% subcutaneous, heparin 7000 units ANESTHESIA/SEDATION: Intravenous Fentanyl and Versed 1.5mg  were administered as conscious sedation during continuous monitoring of the patient's level of consciousness and physiological / cardiorespiratory status by the radiology RN, with a total moderate sedation time of 90  minutes. TECHNIQUE: Informed written consent was obtained from the patient after a thorough discussion of the procedural risks, benefits and alternatives. All questions were addressed. Maximal Sterile Barrier Technique was utilized including caps, mask, sterile gowns, sterile gloves, sterile drape, hand hygiene and skin antiseptic. A timeout was performed prior to the initiation of the procedure. Patency and complete compressibility of bilateral common femoral veins was confirmed with ultrasound and documented. After surgical prep and local lidocaine administration, micropuncture access to right common femoral vein achieved under ultrasound guidance, exchanged over a Bentson wire for a 7 [place] vascular sheath. Outflow right iliac vein and inferior cavogram performed. 6 French angled pigtail catheter advanced into the right pulmonary artery. Pressure measurements obtained. Pulmonary arteriogram performed. Patient was heparinized with 4000 units heparin, and ACT was maintained greater than 200 for the remainder of the procedure, with additional intermittent boluses as needed. Catheter exchanged for a 5 French vert catheter, negotiated into lower lobe branch right pulmonary artery, removed over a stiff Amplatz wire. Vascular sheath was upsized, and 24 [place] FlowTriever device advanced into distal interlobar branch right pulmonary artery. Suction thrombectomy performed. Follow-up selective right pulmonary arteriography was obtained. The catheter was then redirected to the origin of the left pulmonary artery for left pulmonary arteriography. The 5 [place] vert catheter was coaxially advanced for additional selective left pulmonary  arteriography. FlowTriever device advanced into distal left pulmonary artery. Suction mechanical thrombectomy performed. Follow-up selective left pulmonary arteriogram obtained. Catheter was retracted into the distal main pulmonary artery for final pulmonary arteriography and pressure  measurements. Guidewire, catheter and sheath were removed and hemostasis achieved with aid of 0 Prolene pursestring suture. The patient tolerated the procedure well. FLUOROSCOPY TIME:  25.8 minutes; [id number] uGym2 DAP COMPLICATIONS: None immediate. FINDINGS: Outflow right iliac venogram and inferior vena cavagram show no iliocaval thrombus or occlusion. Initial right pulmonary arterial pressure 68/40(53) mmHg. Selective right pulmonary arteriogram shows saddle embolus across its bifurcation with partially occlusive clot extending into the intralobar branch and lower lobe segmental branches. After right pulmonary arterial selective suction thrombectomy, there is clearance of the central clot with some residual partially occlusive clot in distal interlobar and lower lobe segmental branches. Selective left pulmonary arteriogram demonstrates near occlusive thrombus in the proximal left pulmonary artery extending through its length. After left pulmonary artery selective suction thrombectomy, clearance of thrombus from the left pulmonary artery with improved branch perfusion. Final pulmonary arterial pressure 32/6(19) mmHg. IMPRESSION: 1. Negative for right iliac or inferior vena cava thrombus. 2. Bilateral central pulmonary emboli with markedly elevated pulmonary arterial pressures. 3. Good response to selective right and left pulmonary arterial thrombectomy, with near normalization of pulmonary arterial pressures. Electronically Signed   By: Corlis Leak M.D.   On: 02/22/2020 10:01   VAS [place] LOWER EXTREMITY VENOUS (DVT)  Result Date: 02/22/2020  Lower Venous DVT Study Indications: Pulmonary embolism.  Risk Factors: Confirmed PE thrombectomy. Limitations: Body habitus, bandages and patient unable to tolerate compression in groin. Comparison Study: No prior study on file for comparison Performing Technologist: Sherren Kerns RVS  Examination Guidelines: A complete evaluation includes B-mode imaging, spectral Doppler, color  Doppler, and power Doppler as needed of all accessible portions of each vessel. Bilateral testing is considered an integral part of a complete examination. Limited examinations for reoccurring indications may be performed as noted. The reflux portion of the exam is performed with the patient in reverse Trendelenburg.  +-----+---------------+---------+-----------+----------+--------------+ RIGHTCompressibilityPhasicitySpontaneityPropertiesThrombus Aging +-----+---------------+---------+-----------+----------+--------------+ CFV                                               bandages       +-----+---------------+---------+-----------+----------+--------------+   +---------+---------------+---------+-----------+----------+--------------+ LEFT     CompressibilityPhasicitySpontaneityPropertiesThrombus Aging +---------+---------------+---------+-----------+----------+--------------+ CFV                     Yes      Yes                                 +---------+---------------+---------+-----------+----------+--------------+ FV Prox  Full           Yes      Yes                                 +---------+---------------+---------+-----------+----------+--------------+ FV Mid   Full                                                        +---------+---------------+---------+-----------+----------+--------------+  FV DistalFull                                                        +---------+---------------+---------+-----------+----------+--------------+ PFV      Full                                                        +---------+---------------+---------+-----------+----------+--------------+ POP      None           No       No                   Acute          +---------+---------------+---------+-----------+----------+--------------+ PTV      None                                         Acute           +---------+---------------+---------+-----------+----------+--------------+ PERO     None                                         Acute          +---------+---------------+---------+-----------+----------+--------------+ Gastroc  Full                                                        +---------+---------------+---------+-----------+----------+--------------+     Summary: RIGHT: - Unable to visualize CFV secondary to bandages. FV is WNL  LEFT: - Findings consistent with acute deep vein thrombosis involving the left popliteal vein, left posterior tibial veins, and left peroneal veins.  *See table(s) above for measurements and observations. Electronically signed by Lemar LivingsBrandon Cain MD on 02/22/2020 at 5:40:40 PM.    Final     Lab Results:  CBC    Component Value Date/Time   WBC 11.1 (H) 02/22/2020 0247   RBC 4.72 02/22/2020 0247   HGB 13.7 02/22/2020 0247   HCT 43.0 02/22/2020 0247   PLT 197 02/22/2020 0247   MCV 91.1 02/22/2020 0247   MCH 29.0 02/22/2020 0247   MCHC 31.9 02/22/2020 0247   RDW 12.8 02/22/2020 0247   LYMPHSABS 4.9 (H) 02/21/2020 1317   MONOABS 0.8 02/21/2020 1317   EOSABS 0.2 02/21/2020 1317   BASOSABS 0.1 02/21/2020 1317    BMET    Component Value Date/Time   NA 137 02/23/2020 0429   K 3.9 02/23/2020 0429   CL 107 02/23/2020 0429   CO2 22 02/23/2020 0429   GLUCOSE 113 (H) 02/23/2020 0429   BUN 16 02/23/2020 0429   CREATININE 1.09 02/23/2020 0429   CALCIUM 9.0 02/23/2020 0429   GFRNONAA >60 02/23/2020 0429   GFRAA >60 02/23/2020 0429    BNP    Component Value Date/Time   BNP 205.9 (H) 02/21/2020 1317    ProBNP No  results found for: PROBNP  Specialty Problems    None      No Known Allergies   There is no immunization history on file for this patient.  History reviewed. No pertinent past medical history.  Patient does not believe in flu vaccine or Covid vaccine  Tobacco History: Social History   Tobacco Use  Smoking Status  Current Every Day Smoker  . Packs/day: 1.00  . Types: Cigarettes  . Start date: 2001  Smokeless Tobacco Never Used  Tobacco Comment   03/07/20 - 2 cigs a day    Ready to quit: Not Answered Counseling given: Not Answered Comment: 03/07/20 - 2 cigs a day    Continue to not smoke  Outpatient Encounter Medications as of 03/07/2020  Medication Sig  . apixaban (ELIQUIS) 5 MG TABS tablet Take 1 tablet (5 mg total) by mouth 2 (two) times daily. Start after completing the Eliquis Starter Pack  . APIXABAN (ELIQUIS) VTE STARTER PACK (  AND ) Take as directed on package: start with two-5mg  tablets twice daily for 7 days. On day 8 (May 21), switch to one-5mg  tablet twice daily.  . hydrochlorothiazide (HYDRODIURIL) 25 MG tablet Take 1 tablet (25 mg total) by mouth daily.  . [DISCONTINUED] methocarbamol (ROBAXIN) 500 MG tablet Take 500 mg by mouth 2 (two) times daily.   No facility-administered encounter medications on file as of 03/07/2020.     Review of Systems  Review of Systems  Constitutional: Negative for activity change, chills, fatigue, fever and unexpected weight change.  HENT: Negative for postnasal drip, rhinorrhea, sinus pressure, sinus pain and sore throat.   Eyes: Negative.   Respiratory: Positive for shortness of breath. Negative for cough and wheezing.   Cardiovascular: Negative for chest pain and palpitations.  Gastrointestinal: Negative for constipation, diarrhea, nausea and vomiting.  Endocrine: Negative.   Genitourinary: Negative.   Musculoskeletal: Negative.   Skin: Negative.   Neurological: Negative for dizziness and headaches.  Psychiatric/Behavioral: Negative.  Negative for dysphoric mood. The patient is not nervous/anxious.   All other systems reviewed and are negative.    Physical Exam  BP 122/84 (BP Location: Right Arm, Cuff Size: Large)   Pulse 100   Temp 97.9 F (36.6 C) (Oral)   Ht 6' 2.5" (1.892 m)   Wt (!) 381 lb (172.8 kg)   SpO2 97%   BMI  48.26 kg/m   Wt Readings from Last 5 Encounters:  03/07/20 (!) 381 lb (172.8 kg)  02/21/20 (!) 370 lb 9.5 oz (168.1 kg)   Reviewed with patient that weight is increasing  BMI Readings from Last 5 Encounters:  03/07/20 48.26 kg/m  02/21/20 46.32 kg/m    Physical Exam Vitals and nursing note reviewed.  Constitutional:      General: He is not in acute distress.    Appearance: Normal appearance. He is obese.  HENT:     Head: Normocephalic and atraumatic.     Right Ear: Hearing and external ear normal.     Left Ear: Hearing and external ear normal.     Nose: Nose normal. No mucosal edema or rhinorrhea.     Right Turbinates: Not enlarged.     Left Turbinates: Not enlarged.     Mouth/Throat:     Mouth: Mucous membranes are dry.     Pharynx: Oropharynx is clear. No oropharyngeal exudate.     Comments: Mallampati 3 Eyes:     Pupils: Pupils are equal, round, and reactive to light.  Cardiovascular:  Rate and Rhythm: Normal rate and regular rhythm.     Pulses: Normal pulses.     Heart sounds: Normal heart sounds. No murmur.  Pulmonary:     Effort: Pulmonary effort is normal.     Breath sounds: Normal breath sounds. No decreased breath sounds, wheezing or rales.  Musculoskeletal:     Cervical back: Normal range of motion.     Right lower leg: No edema.     Left lower leg: No edema.  Lymphadenopathy:     Cervical: No cervical adenopathy.  Skin:    General: Skin is warm and dry.     Capillary Refill: Capillary refill takes less than 2 seconds.     Findings: No erythema or rash.  Neurological:     General: No focal deficit present.     Mental Status: He is alert and oriented to person, place, and time.     Motor: No weakness.     Coordination: Coordination normal.     Gait: Gait is intact. Gait normal.  Psychiatric:        Mood and Affect: Mood normal.        Behavior: Behavior normal. Behavior is cooperative.        Thought Content: Thought content normal.         Judgment: Judgment normal.       Assessment & Plan:   VTE (venous thromboembolism) Plan: Continue Eliquis Work on stopping smoking We will order echocardiogram to be completed in 3 months We will order lower extremity Doppler to be completed in 3 months Lab work to be completed in 2 weeks Establish with primary care Referral to hematology  Acute deep vein thrombosis (DVT) (HCC) Plan: Continue Eliquis Referral to hematology Complete lower extremity Doppler in 3 months  Acute pulmonary embolism (HCC) Plan: Continue Eliquis Referral to hematology Repeat echocardiogram in 3 months Repeat lower extremity Doppler in 3 months    Pulmonary HTN (HCC) RV strain Elevated pulmonary pressures in the setting of saddle PE High likelihood for obstructive sleep apnea BMI 48.26 STOP-BANG score 7  Plan: Continue Eliquis Repeat echocardiogram in 3 months Split-night sleep study ordered    Return in about 2 months (around 05/07/2020), or if symptoms worsen or fail to improve, for Follow up with Dr. Wynona Neat, 30 MINUTE SLOT.   Coral Ceo, NP 03/07/2020   This appointment required 45 minutes of patient care (this includes precharting, chart review, review of results, face-to-face care, etc.).

## 2020-03-07 ENCOUNTER — Ambulatory Visit: Payer: BC Managed Care – PPO | Admitting: Pulmonary Disease

## 2020-03-07 ENCOUNTER — Encounter: Payer: Self-pay | Admitting: Pulmonary Disease

## 2020-03-07 ENCOUNTER — Other Ambulatory Visit: Payer: Self-pay

## 2020-03-07 VITALS — BP 122/84 | HR 100 | Temp 97.9°F | Ht 74.5 in | Wt 381.0 lb

## 2020-03-07 DIAGNOSIS — F17200 Nicotine dependence, unspecified, uncomplicated: Secondary | ICD-10-CM | POA: Diagnosis not present

## 2020-03-07 DIAGNOSIS — I2602 Saddle embolus of pulmonary artery with acute cor pulmonale: Secondary | ICD-10-CM

## 2020-03-07 DIAGNOSIS — I8290 Acute embolism and thrombosis of unspecified vein: Secondary | ICD-10-CM | POA: Diagnosis not present

## 2020-03-07 DIAGNOSIS — Z6841 Body Mass Index (BMI) 40.0 and over, adult: Secondary | ICD-10-CM

## 2020-03-07 DIAGNOSIS — I82432 Acute embolism and thrombosis of left popliteal vein: Secondary | ICD-10-CM

## 2020-03-07 DIAGNOSIS — Z9189 Other specified personal risk factors, not elsewhere classified: Secondary | ICD-10-CM

## 2020-03-07 DIAGNOSIS — G4733 Obstructive sleep apnea (adult) (pediatric): Secondary | ICD-10-CM | POA: Insufficient documentation

## 2020-03-07 DIAGNOSIS — Z87891 Personal history of nicotine dependence: Secondary | ICD-10-CM | POA: Insufficient documentation

## 2020-03-07 DIAGNOSIS — I272 Pulmonary hypertension, unspecified: Secondary | ICD-10-CM | POA: Insufficient documentation

## 2020-03-07 NOTE — Patient Instructions (Addendum)
You were seen today by Lauraine Rinne, NP  for:   1. Acute saddle pulmonary embolism with acute cor pulmonale (HCC) 2. VTE (venous thromboembolism)  - VAS [place] LOWER EXTREMITY VENOUS (DVT); Future - Split night study; Future - ECHOCARDIOGRAM COMPLETE; Future - Hepatic function panel; Future - Comp Met (CMET); Future - CBC with Differential/Platelet; Future - Ambulatory referral to Hematology  Continue Eliquis  We will refer you to hematology  Walk today in office  Please complete lab work in 2 weeks at [facility]   3. At risk for obstructive sleep apnea  - Split night study; Future  4. Current smoker  We recommend that you stop smoking.  >>>You need to set a quit date >>>If you have friends or family who smoke, let them know you are trying to quit and not to smoke around you or in your living environment  Smoking Cessation Resources:  1 800 QUIT NOW  >>> Patient to call this resource and utilize it to help support her quit smoking >>> Keep up your hard work with stopping smoking  You can also contact the [facility] >>>For smoking cessation classes call [phone]  We do not recommend using e-cigarettes as a form of stopping smoking  You can sign up for smoking cessation support texts and information:  >>>https://smokefree.gov/smokefreetxt    5. Class 3 severe obesity without serious comorbidity with body mass index (BMI) of 45.0 to 49.9 in adult, unspecified obesity type ([place])    We recommend today:  Orders Placed This Encounter  Procedures  . Hepatic function panel    Standing Status:   Future    Standing Expiration Date:   03/07/2021  . Comp Met (CMET)    Standing Status:   Future    Standing Expiration Date:   03/07/2021  . CBC with Differential/Platelet    Standing Status:   Future    Standing Expiration Date:   03/07/2021  . Ambulatory referral to Hematology    Referral Priority:   Routine    Referral Type:   Consultation    Referral  Reason:   Specialty Services Required    Requested Specialty:   Oncology    Number of Visits Requested:   1  . ECHOCARDIOGRAM COMPLETE    Standing Status:   Future    Standing Expiration Date:   09/07/2020    Scheduling Instructions:     Aug/2021    Order Specific Question:   Where should this test be performed    Answer:   [facility])    Order Specific Question:   Does the patient weigh less than or greater than 250 lbs?    Answer:   Patient weighs greater than 250 lbs    Order Specific Question:   Perflutren DEFINITY (image enhancing agent) should be administered unless hypersensitivity or allergy exist    Answer:   Administer Perflutren    Order Specific Question:   Reason for exam-Echo    Answer:   Pulmonary Embolus  415.19 / I26.99    Order Specific Question:   Release to patient    Answer:   Immediate  . Split night study    Standing Status:   Future    Standing Expiration Date:   09/07/2020    Order Specific Question:   Where should this test be performed:    Answer:   [facility] - Pulmonary   Orders Placed This Encounter  Procedures  . Hepatic  function panel  . Comp Met (CMET)  . CBC with Differential/Platelet  . Ambulatory referral to Hematology  . ECHOCARDIOGRAM COMPLETE  . Split night study  . VAS [place] LOWER EXTREMITY VENOUS (DVT)   No orders of the defined types were placed in this encounter.   Follow Up:    Return in about 2 months (around 05/07/2020), or if symptoms worsen or fail to improve, for Follow up with Dr. Ander Slade, [place].   Please do your part to reduce the spread of COVID-19:      Reduce your risk of any infection  and COVID19 by using the similar precautions used for avoiding the common cold or flu:  Marland Kitchen Wash your hands often with soap and warm water for at least 20 seconds.  If soap and water are not readily available, use an alcohol-based hand sanitizer with at least 60% alcohol.  . If coughing or sneezing, cover your  mouth and nose by coughing or sneezing into the elbow areas of your shirt or coat, into a tissue or into your sleeve (not your hands). Langley Gauss A MASK when in public  . Avoid shaking hands with others and consider head nods or verbal greetings only. . Avoid touching your eyes, nose, or mouth with unwashed hands.  . Avoid close contact with people who are sick. . Avoid places or events with large numbers of people in one location, like concerts or sporting events. . If you have some symptoms but not all symptoms, continue to monitor at home and seek medical attention if your symptoms worsen. . If you are having a medical emergency, call 911.   [place] / e-Visit: [email or web address]         [facility]: [facility]: [phone]                   [facility]: [phone]     It is flu season:   >>> Best ways to protect herself from the flu: Receive the yearly flu vaccine, practice good hand hygiene washing with soap and also using hand sanitizer when available, eat a nutritious meals, get adequate rest, hydrate appropriately   Please contact the office if your symptoms worsen or you have concerns that you are not improving.   Thank you for choosing [facility] for your healthcare, and for allowing [place] to partner with you on your healthcare journey. I am thankful to be able to provide care to you today.   Wyn Quaker FNP-C

## 2020-03-07 NOTE — Assessment & Plan Note (Signed)
Plan: Continue Eliquis Referral to hematology Repeat echocardiogram in 3 months Repeat lower extremity Doppler in 3 months

## 2020-03-07 NOTE — Assessment & Plan Note (Signed)
RV strain Elevated pulmonary pressures in the setting of saddle PE High likelihood for obstructive sleep apnea BMI 48.26 STOP-BANG score 7  Plan: Continue Eliquis Repeat echocardiogram in 3 months Split-night sleep study ordered

## 2020-03-07 NOTE — Assessment & Plan Note (Signed)
Plan: Continue Eliquis Referral to hematology Complete lower extremity Doppler in 3 months

## 2020-03-07 NOTE — Assessment & Plan Note (Signed)
Plan: Split-night sleep study ordered  

## 2020-03-07 NOTE — Assessment & Plan Note (Signed)
Plan: Continue Eliquis Work on stopping smoking We will order echocardiogram to be completed in 3 months We will order lower extremity Doppler to be completed in 3 months Lab work to be completed in 2 weeks Establish with primary care Referral to hematology

## 2020-03-19 ENCOUNTER — Telehealth: Payer: Self-pay | Admitting: Oncology

## 2020-03-19 NOTE — Telephone Encounter (Signed)
Mr. Nickerson returned my call and has been scheduled to see Dr. Clelia Croft on 6/10 at 11am. Mr. Duran has been made aware to arrive 15 minutes early.

## 2020-03-21 ENCOUNTER — Inpatient Hospital Stay: Payer: BC Managed Care – PPO | Attending: Oncology | Admitting: Oncology

## 2020-03-21 ENCOUNTER — Telehealth: Payer: Self-pay | Admitting: Oncology

## 2020-03-21 ENCOUNTER — Inpatient Hospital Stay: Payer: BC Managed Care – PPO

## 2020-03-21 ENCOUNTER — Other Ambulatory Visit: Payer: Self-pay

## 2020-03-21 VITALS — BP 156/116 | HR 93 | Temp 97.3°F | Resp 18 | Ht 74.5 in | Wt 381.1 lb

## 2020-03-21 DIAGNOSIS — Z86718 Personal history of other venous thrombosis and embolism: Secondary | ICD-10-CM | POA: Diagnosis not present

## 2020-03-21 DIAGNOSIS — Z832 Family history of diseases of the blood and blood-forming organs and certain disorders involving the immune mechanism: Secondary | ICD-10-CM | POA: Diagnosis not present

## 2020-03-21 DIAGNOSIS — I2602 Saddle embolus of pulmonary artery with acute cor pulmonale: Secondary | ICD-10-CM

## 2020-03-21 DIAGNOSIS — E669 Obesity, unspecified: Secondary | ICD-10-CM | POA: Diagnosis not present

## 2020-03-21 DIAGNOSIS — Z7901 Long term (current) use of anticoagulants: Secondary | ICD-10-CM

## 2020-03-21 DIAGNOSIS — I10 Essential (primary) hypertension: Secondary | ICD-10-CM | POA: Diagnosis not present

## 2020-03-21 DIAGNOSIS — F17210 Nicotine dependence, cigarettes, uncomplicated: Secondary | ICD-10-CM | POA: Diagnosis not present

## 2020-03-21 LAB — ANTITHROMBIN III: AntiThromb III Func: 95 % (ref 75–120)

## 2020-03-21 NOTE — Progress Notes (Signed)
Reason for the request:    Pulmonary emboli  HPI: I was asked by Elisha Headland, NP to evaluate Riley Wells for the evaluation of venous thromboembolism.  He is a 34 year old man with history of hypertension and obesity that was hospitalized in May of 2021.  He presented acutely with shortness of breath and difficulty breathing on 02/21/2020.  He was evaluated at [facility] emergency department and CT scan of the chest showed acute saddle pulmonary embolism with evidence of right heart strain.  He was transferred urgently to [facility] and underwent urgent thrombectomy by interventional radiology.  He noted immediate improvement in his shortness of breath and normalization of his pressure.  He was subsequently was discharged on Eliquis which she has been on since that time.  He denies any provoking symptoms leading up to this event.  He denies any recent travel, surgery or immobility.  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  Denies any trauma or any changes in his medication.  He did have a previous episode in 2018 while he was traveling to [place] and noted calf pain and was diagnosed with left DVT at that time.  He was treated with Xarelto for 6 months.  He does have family history of venous thromboembolism including his grandfather who is on warfarin and his father passed away although the details are unclear to me the nature of his VTE.  Clinically, he reports feeling well at this time without any problems related to Eliquis.  Denies any hematochezia melena or hemoptysis.  Denies shortness of breath or difficulty breathing.    He does not report any headaches, blurry vision, syncope or seizures. Does not report any fevers, chills or sweats.  Does not report any cough, wheezing or hemoptysis.  Does not report any chest pain, palpitation, orthopnea or leg edema.  Does not report any nausea, vomiting or abdominal pain.  Does not report any  constipation or diarrhea.  Does not report any skeletal complaints.    Does not report frequency, urgency or hematuria.  Does not report any skin rashes or lesions. Does not report any heat or cold intolerance.  Does not report any lymphadenopathy or petechiae.  Does not report any anxiety or depression.  Remaining review of systems is negative.    Past medical history significant for hypertension and  Past Surgical History:  Procedure Laterality Date  . IR ANGIOGRAM PULMONARY BILATERAL SELECTIVE  02/21/2020  . IR ANGIOGRAM SELECTIVE EACH ADDITIONAL VESSEL  02/21/2020  . IR ANGIOGRAM SELECTIVE EACH ADDITIONAL VESSEL  02/21/2020  . IR THROMBECT VENO MECH MOD SED  02/21/2020  . IR US GUIDE VASC ACCESS RIGHT  02/21/2020  :   Current Outpatient Medications:  .  apixaban (ELIQUIS) 5 MG TABS tablet, Take 1 tablet (5 mg total) by mouth 2 (two) times daily. Start after completing the Eliquis Starter Pack, Disp: 60 tablet, Rfl: 2 .  APIXABAN (ELIQUIS) VTE STARTER PACK (10MG  AND 5MG ), Take as directed on package: start with two-5mg  tablets twice daily for 7 days. On day 8 (May 21), switch to one-5mg  tablet twice daily., Disp: 1 each, Rfl: 0 .  hydrochlorothiazide (HYDRODIURIL) 25 MG tablet, Take 1 tablet (25 mg total) by mouth daily., Disp: 30 tablet, Rfl: 0:  No Known Allergies:   Family history significant for venous thromboembolism in his grandfather and father.  Social History   Socioeconomic History  . Marital status: Married  Spouse name: Not on file  . Number of children: Not on file  . Years of education: Not on file  . Highest education level: Not on file  Occupational History  . Not on file  Tobacco Use  . Smoking status: Current Every Day Smoker    Packs/day: 1.00    Types: Cigarettes    Start date: 2001  . Smokeless tobacco: Never Used  . Tobacco comment: 03/07/20 - 2 cigs a day   Vaping Use  . Vaping Use: Former  . Quit date: 01/29/2020  Substance and Sexual Activity  .  Alcohol use: Not on file  . Drug use: Not on file  . Sexual activity: Not on file  Other Topics Concern  . Not on file  Social History Narrative  . Not on file   Social Determinants of Health   Financial Resource Strain:   . Difficulty of Paying Living Expenses:   Food Insecurity:   . Worried About Charity fundraiser in the Last Year:   . Arboriculturist in the Last Year:   Transportation Needs:   . Film/video editor (Medical):   Marland Kitchen Lack of Transportation (Non-Medical):   Physical Activity:   . Days of Exercise per Week:   . Minutes of Exercise per Session:   Stress:   . Feeling of Stress :   Social Connections:   . Frequency of Communication with Friends and Family:   . Frequency of Social Gatherings with Friends and Family:   . Attends Religious Services:   . Active Member of Clubs or Organizations:   . Attends Archivist Meetings:   Marland Kitchen Marital Status:   Intimate Partner Violence:   . Fear of Current or Ex-Partner:   . Emotionally Abused:   Marland Kitchen Physically Abused:   . Sexually Abused:   :  Pertinent items are noted in HPI.  Exam: Blood pressure (!) 156/116, pulse 93, temperature (!) 97.3 F (36.3 C), temperature source Temporal, resp. rate 18, height 6' 2.5" (1.892 m), weight (!) 381 lb 1.6 oz (172.9 kg), SpO2 98 %.  ECOG 0 General appearance: alert and cooperative appeared without distress. Head: atraumatic without any abnormalities. Eyes: conjunctivae/corneas clear. PERRL.  Sclera anicteric. Throat: lips, mucosa, and tongue normal; without oral thrush or ulcers. Resp: clear to auscultation bilaterally without rhonchi, wheezes or dullness to percussion. Cardio: regular rate and rhythm, S1, S2 normal, no murmur, click, rub or gallop GI: soft, non-tender; bowel sounds normal; no masses,  no organomegaly Skin: Skin color, texture, turgor normal. No rashes or lesions Lymph nodes: Cervical, supraclavicular, and axillary nodes normal. Neurologic: Grossly  normal without any motor, sensory or deep tendon reflexes. Musculoskeletal: No joint deformity or effusion.  CBC    Component Value Date/Time   WBC 11.1 (H) 02/22/2020 0247   RBC 4.72 02/22/2020 0247   HGB 13.7 02/22/2020 0247   HCT 43.0 02/22/2020 0247   PLT 197 02/22/2020 0247   MCV 91.1 02/22/2020 0247   MCH 29.0 02/22/2020 0247   MCHC 31.9 02/22/2020 0247   RDW 12.8 02/22/2020 0247   LYMPHSABS 4.9 (H) 02/21/2020 1317   MONOABS 0.8 02/21/2020 1317   EOSABS 0.2 02/21/2020 1317   BASOSABS 0.1 02/21/2020 1317     Chemistry      Component Value Date/Time   NA 137 02/23/2020 0429   K 3.9 02/23/2020 0429   CL 107 02/23/2020 0429   CO2 22 02/23/2020 0429   BUN 16 02/23/2020 0429  CREATININE 1.09 02/23/2020 0429      Component Value Date/Time   CALCIUM 9.0 02/23/2020 0429   ALKPHOS 45 02/23/2020 0429   AST 36 02/23/2020 0429   ALT 79 (H) 02/23/2020 0429   BILITOT 1.0 02/23/2020 0429         Assessment and Plan:    34 year old with:  1.  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  He required thrombectomy and currently on Eliquis.  He did have a DVT on the left in 2018 which was provoked after car ride.  He does have family history of venous thromboembolism.  The natural course of these findings and management options were reviewed this time.  It is concerning that he had to episodes of thrombosis with the most recent appears to be unprovoked and presented with a saddle pulmonary embolism.  His risk factor include obesity and possible occupation and travel in 2018 but otherwise there is no clear-cut provoking factors.  Inherited or acquired thrombophilia is certainly a possibility given this presentation.  Risks and benefits of obtaining a hypercoagulable panel were reviewed today and he is interested in doing.  It would be essential to do that to identify  if there is any genetic component to his thrombophilia which could help him in the future as well as potentially any future children.  The duration of anticoagulation was discussed today and certainly will be lifetime anticoagulation given the unprovoked and the serious nature of his second VTE.  Long-term anticoagulation complications were reviewed including bleeding, cost among others.  GI bleeding and CNS bleeding were also reiterated.  Clearly the risks at this point are just applied for the benefit and lifetime anticoagulation is likely despite his young age.  We will obtain hypercoagulable work-up and communicate these results to him.  2.  Follow-up: He will return in 9 months for repeat evaluation.  45  minutes were dedicated to this visit. The time was spent on reviewing  imaging studies, discussing treatment options, discussing differential diagnosis and answering questions regarding future plan.      A copy of this consult has been forwarded to the requesting physician.

## 2020-03-21 NOTE — Telephone Encounter (Signed)
Scheduled appt per 6/10 los. ° °Printed calendar and avs. °

## 2020-03-22 LAB — PROTEIN C ACTIVITY: Protein C Activity: 120 % (ref 73–180)

## 2020-03-22 LAB — CARDIOLIPIN ANTIBODIES, IGG, IGM, IGA
Anticardiolipin IgA: <9 APL U/mL (ref 0–11)
Anticardiolipin IgG: <9 GPL U/mL (ref 0–14)
Anticardiolipin IgM: 14 MPL U/mL — ABNORMAL HIGH (ref 0–12)

## 2020-03-22 LAB — PROTEIN S ACTIVITY: Protein S Activity: 89 % (ref 63–140)

## 2020-03-22 LAB — BETA-2-GLYCOPROTEIN I ABS, IGG/M/A
Beta-2 Glyco I IgG: <9 GPI IgG units (ref 0–20)
Beta-2-Glycoprotein I IgA: <9 GPI IgA units (ref 0–25)
Beta-2-Glycoprotein I IgM: 11 GPI IgM units (ref 0–32)

## 2020-03-22 LAB — HOMOCYSTEINE: Homocysteine: 9.4 umol/L (ref 0.0–14.5)

## 2020-03-22 LAB — PROTEIN S, TOTAL: Protein S Ag, Total: 84 % (ref 60–150)

## 2020-03-23 LAB — PROTEIN C, TOTAL: Protein C, Total: 105 % (ref 60–150)

## 2020-03-25 ENCOUNTER — Other Ambulatory Visit (HOSPITAL_COMMUNITY)
Admission: RE | Admit: 2020-03-25 | Discharge: 2020-03-25 | Disposition: A | Discharge status: Home / Self Care | Payer: BC Managed Care – PPO | Source: Ambulatory Visit | Attending: Pulmonary Disease | Admitting: Pulmonary Disease

## 2020-03-25 DIAGNOSIS — Z01812 Encounter for preprocedural laboratory examination: Secondary | ICD-10-CM | POA: Insufficient documentation

## 2020-03-25 DIAGNOSIS — Z20822 Contact with and (suspected) exposure to covid-19: Secondary | ICD-10-CM | POA: Insufficient documentation

## 2020-03-25 LAB — DRVVT MIX: dRVVT Mix: 41.6 s — ABNORMAL HIGH (ref 0.0–40.4)

## 2020-03-25 LAB — SARS CORONAVIRUS 2 (TAT 6-24 HRS): SARS Coronavirus 2: NEGATIVE

## 2020-03-25 LAB — LUPUS ANTICOAGULANT PANEL
DRVVT: 51.7 s — ABNORMAL HIGH (ref 0.0–47.0)
PTT Lupus Anticoagulant: 34.7 s (ref 0.0–51.9)

## 2020-03-25 LAB — DRVVT CONFIRM: dRVVT Confirm: 1.4 ratio — ABNORMAL HIGH (ref 0.8–1.2)

## 2020-03-26 LAB — FACTOR 5 LEIDEN

## 2020-03-27 ENCOUNTER — Ambulatory Visit (HOSPITAL_BASED_OUTPATIENT_CLINIC_OR_DEPARTMENT_OTHER): Payer: BC Managed Care – PPO | Attending: Pulmonary Disease | Admitting: Pulmonary Disease

## 2020-03-27 ENCOUNTER — Other Ambulatory Visit: Payer: Self-pay

## 2020-03-27 DIAGNOSIS — Z9189 Other specified personal risk factors, not elsewhere classified: Secondary | ICD-10-CM | POA: Diagnosis not present

## 2020-03-27 DIAGNOSIS — I8290 Acute embolism and thrombosis of unspecified vein: Secondary | ICD-10-CM | POA: Diagnosis present

## 2020-03-28 LAB — PROTHROMBIN GENE MUTATION

## 2020-03-29 DIAGNOSIS — I8290 Acute embolism and thrombosis of unspecified vein: Secondary | ICD-10-CM | POA: Diagnosis not present

## 2020-03-29 NOTE — Procedures (Signed)
    Patient Name: Riley Wells, [place] Date: 03/27/2020 Gender: Male D.O.B: 1986-04-09 Age (years): 33 Referring Provider: Lauraine Rinne NP Height (inches): 20 Interpreting Physician: Chesley Mires MD, ABSM Weight (lbs): 380 RPSGT: Carolin Coy BMI: 56 MRN: [id number] Neck Size: 18.50  CLINICAL INFORMATION Sleep Study Type: Split Night CPAP  Indication for sleep study: Obesity, OSA  Epworth Sleepiness Score: 7  SLEEP STUDY TECHNIQUE As per the AASM Manual for the Scoring of Sleep and Associated Events v2.3 (April 2016) with a hypopnea requiring 4% desaturations.  The channels recorded and monitored were frontal, central and occipital EEG, electrooculogram (EOG), submentalis EMG (chin), nasal and oral airflow, thoracic and abdominal wall motion, anterior tibialis EMG, snore microphone, electrocardiogram, and pulse oximetry. Continuous positive airway pressure (CPAP) was initiated when the patient met split night criteria and was titrated according to treat sleep-disordered breathing.  MEDICATIONS Medications self-administered by patient taken the night of the study : N/A  RESPIRATORY PARAMETERS Diagnostic  Total AHI (/hr): 47.7 RDI (/hr): 54.4 OA Index (/hr): 2.4 CA Index (/hr): 0.5 REM AHI (/hr): 153.8 NREM AHI (/hr): 28.2 Supine AHI (/hr): 0.0 Non-supine AHI (/hr): 49.5 Min O2 Sat (%): 76.0 Mean O2 (%): 93.9 Time below 88% (min): 4.9   Titration  Optimal Pressure (cm): 13 AHI at Optimal Pressure (/hr): 0.0 Min O2 at Optimal Pressure (%): 91.0 Supine % at Optimal (%): 18 Sleep % at Optimal (%): 97   SLEEP ARCHITECTURE The recording time for the entire night was 432.5 minutes.  During a baseline period of 208.6 minutes, the patient slept for 125.8 minutes in REM and nonREM, yielding a sleep efficiency of 60.3%%. Sleep onset after lights out was 54.3 minutes with a REM latency of 53.5 minutes. The patient spent 9.1%% of the night in stage N1 sleep, 75.4%% in stage N2  sleep, 0.0%% in stage N3 and 15.5% in REM.  During the titration period of 221.5 minutes, the patient slept for 217.0 minutes in REM and nonREM, yielding a sleep efficiency of 98.0%%. Sleep onset after CPAP initiation was 1.2 minutes with a REM latency of 26.0 minutes. The patient spent 2.3%% of the night in stage N1 sleep, 61.5%% in stage N2 sleep, 0.0%% in stage N3 and 36.2% in REM.  CARDIAC DATA The 2 lead EKG demonstrated sinus rhythm. The mean heart rate was 100.0 beats per minute. Other EKG findings include: None.  LEG MOVEMENT DATA The total Periodic Limb Movements of Sleep (PLMS) were 0. The PLMS index was 0.0 .  IMPRESSIONS - Severe obstructive sleep apnea with an AHI of 47.7 and SpO2 low of 76%. - He did well with CPAP 13 cm H2O. - He did not require supplemental oxygen during this sleep study.  DIAGNOSIS - Obstructive Sleep Apnea (327.23 [G47.33 ICD-10])  RECOMMENDATIONS - Trial of CPAP therapy on 13 cm H2O with a Medium Wide size Philips Respironics Full Face Mask Dreamwear mask and heated humidification.  [Electronically signed] 03/29/2020 05:53 PM  Chesley Mires MD, [place], American Board of Sleep Medicine   NPI: [id number]

## 2020-04-02 ENCOUNTER — Telehealth: Payer: Self-pay | Admitting: Pulmonary Disease

## 2020-04-02 DIAGNOSIS — G4733 Obstructive sleep apnea (adult) (pediatric): Secondary | ICD-10-CM

## 2020-04-02 NOTE — Telephone Encounter (Signed)
04/02/2020  Please contact the patient and let him know that we have received his sleep study results are listed below:  IMPRESSIONS - Severe obstructive sleep apnea with an AHI of 47.7 and SpO2 low of 76%. - He did well with CPAP 13 cm H2O. - He did not require supplemental oxygen during this sleep study.   Based off these recommendations would recommend starting CPAP therapy with a set pressure of 13.  If patient is agreeable go ahead and place order for CPAP to be started.  Will need follow-up with our office in 2 months to show CPAP compliance.  If patient has additional questions or concerns regarding sleep study please set patient up for virtual or in office visit with myself to further discuss and review.  Elisha Headland, FNP

## 2020-04-02 NOTE — Telephone Encounter (Signed)
Called and left message for patient to return call for home sleep study results.  

## 2020-04-10 NOTE — Telephone Encounter (Signed)
Patient contacted with results of sleep study. Patient agrees to start CPAP therapy, DME order placed. Follow up appointment made.

## 2020-04-10 NOTE — Telephone Encounter (Signed)
Pt returning missed call. Can be reached at [phone] before 3:00ish per pt.

## 2020-04-18 ENCOUNTER — Telehealth: Payer: Self-pay | Admitting: Pulmonary Disease

## 2020-04-18 NOTE — Telephone Encounter (Signed)
Insurance is [employer]

## 2020-04-18 NOTE — Telephone Encounter (Signed)
We recommend starting CPAP therapy. I do not know how to get his insurance to cover it.   Can you contact DME? What options do they have for cash pay? Can they give the patient an estimate.   Elisha Headland FNP

## 2020-04-18 NOTE — Telephone Encounter (Signed)
Patient does not have DME insurance to cover his CPAP. Please advise

## 2020-04-19 NOTE — Telephone Encounter (Signed)
Spoke to [employer] at [place].  She states she did speak to pt's wife about the cpap and no dme coverage.  Tiffany gave her their website info so they can purchase cpap online.  Wife was going to verify no dme coverage and if she found out he did have coverage she was going to call [employer] back.  Tiffany states she hasn't heard back from her.  So they are aware no coverage and have been given info on how to purchase machine.  Nothing further needed.

## 2020-04-19 NOTE — Telephone Encounter (Signed)
PCCs, please advise. 

## 2020-04-19 NOTE — Telephone Encounter (Signed)
If a pt does not have dme coverage then that means they will not cover any supplies.  It has to do with the type of insurance the pt has.  There isn't anything we can do.  The cpap assistance program doesn't exist anymore.  Tried to call [employer] to speak to Tiffany to make sure pt was aware - lines were busy & was transferred away from local office.  I hung up and tried to call pt to see if he was aware - had to leave him a vm to call me back.

## 2020-05-03 ENCOUNTER — Telehealth: Payer: Self-pay | Admitting: Pulmonary Disease

## 2020-05-06 NOTE — Telephone Encounter (Signed)
[employer] is an Scientist, research (life sciences).  I let pt know I am sending to [employer].  Pt verbalized understanding.  Nothing further needed at this time.

## 2020-05-24 ENCOUNTER — Ambulatory Visit (HOSPITAL_COMMUNITY)
Admission: RE | Admit: 2020-05-24 | Discharge: 2020-05-24 | Disposition: A | Discharge status: Home / Self Care | Payer: Managed Care, Other (non HMO) | Source: Ambulatory Visit | Attending: Cardiovascular Disease | Admitting: Cardiovascular Disease

## 2020-05-24 ENCOUNTER — Other Ambulatory Visit: Payer: Self-pay

## 2020-05-24 DIAGNOSIS — I8290 Acute embolism and thrombosis of unspecified vein: Secondary | ICD-10-CM | POA: Insufficient documentation

## 2020-05-24 DIAGNOSIS — Z86718 Personal history of other venous thrombosis and embolism: Secondary | ICD-10-CM

## 2020-05-28 NOTE — Progress Notes (Signed)
Spoke with patient regarding results per Elisha Headland NP.  He verbalized understanding.  Nothing further needed.

## 2020-05-30 ENCOUNTER — Other Ambulatory Visit: Payer: Self-pay

## 2020-05-30 ENCOUNTER — Ambulatory Visit (HOSPITAL_COMMUNITY): Payer: Managed Care, Other (non HMO) | Attending: Cardiology

## 2020-05-30 DIAGNOSIS — I8290 Acute embolism and thrombosis of unspecified vein: Secondary | ICD-10-CM | POA: Diagnosis present

## 2020-05-30 DIAGNOSIS — I2699 Other pulmonary embolism without acute cor pulmonale: Secondary | ICD-10-CM | POA: Insufficient documentation

## 2020-05-30 DIAGNOSIS — I10 Essential (primary) hypertension: Secondary | ICD-10-CM | POA: Insufficient documentation

## 2020-05-30 LAB — ECHOCARDIOGRAM COMPLETE
Area-P 1/2: 3.42 cm2
S' Lateral: 3.6 cm

## 2020-05-30 MED ORDER — PERFLUTREN LIPID MICROSPHERE
1.0000 mL | INTRAVENOUS | Status: AC | PRN
  Administered 2020-05-30: 4 mL via INTRAVENOUS

## 2020-06-10 ENCOUNTER — Ambulatory Visit: Payer: BC Managed Care – PPO | Admitting: Pulmonary Disease

## 2020-06-11 ENCOUNTER — Ambulatory Visit: Payer: Managed Care, Other (non HMO) | Admitting: Pulmonary Disease

## 2020-06-11 ENCOUNTER — Encounter: Payer: Self-pay | Admitting: Pulmonary Disease

## 2020-06-11 ENCOUNTER — Other Ambulatory Visit: Payer: Self-pay

## 2020-06-11 VITALS — BP 128/82 | HR 80 | Temp 98.2°F | Ht 74.0 in | Wt 380.4 lb

## 2020-06-11 DIAGNOSIS — G4733 Obstructive sleep apnea (adult) (pediatric): Secondary | ICD-10-CM

## 2020-06-11 DIAGNOSIS — Z86718 Personal history of other venous thrombosis and embolism: Secondary | ICD-10-CM | POA: Diagnosis not present

## 2020-06-11 DIAGNOSIS — Z86711 Personal history of pulmonary embolism: Secondary | ICD-10-CM

## 2020-06-11 DIAGNOSIS — I272 Pulmonary hypertension, unspecified: Secondary | ICD-10-CM

## 2020-06-11 DIAGNOSIS — I8290 Acute embolism and thrombosis of unspecified vein: Secondary | ICD-10-CM

## 2020-06-11 DIAGNOSIS — Z87891 Personal history of nicotine dependence: Secondary | ICD-10-CM

## 2020-06-11 NOTE — Patient Instructions (Addendum)
You were seen today by Coral Ceo, NP  for:   1. VTE (venous thromboembolism) 2. History of pulmonary embolus (PE) 3. History of DVT (deep vein thrombosis)  Continue Eliquis  Keep upcoming follow-up with hematology in March/2022  4. Severe obstructive sleep apnea  We recommend that you continue using your CPAP daily >>>Keep up the hard work using your device >>> Goal should be wearing this for the entire night that you are sleeping, at least 4 to 6 hours  Remember:  . Do not drive or operate heavy machinery if tired or drowsy.  . Please notify the supply company and office if you are unable to use your device regularly due to missing supplies or machine being broken.  . Work on maintaining a healthy weight and following your recommended nutrition plan  . Maintain proper daily exercise and movement  . Maintaining proper use of your device can also help improve management of other chronic illnesses such as: Blood pressure, blood sugars, and weight management.   BiPAP/ CPAP Cleaning:  >>>Clean weekly, with Dawn soap, and bottle brush.  Set up to air dry. >>> Wipe mask out daily with wet wipe or towelette    5. Former smoker  Congrats on stopping smoking were proud of you!  Keep up the hard work  Follow Up:    Return in about 2 months (around 08/11/2020), or if symptoms worsen or fail to improve, for Follow up with Dr. Wynona Neat, 30 MINUTE SLOT.  Needs to be Dr. Val Eagle       Please do your part to reduce the spread of COVID-19:      Reduce your risk of any infection  and COVID19 by using the similar precautions used for avoiding the common cold or flu:  Marland Kitchen Wash your hands often with soap and warm water for at least 20 seconds.  If soap and water are not readily available, use an alcohol-based hand sanitizer with at least 60% alcohol.  . If coughing or sneezing, cover your mouth and nose by coughing or sneezing into the elbow areas of your shirt or coat, into a tissue or into  your sleeve (not your hands). Drinda Butts A MASK when in public  . Avoid shaking hands with others and consider head nods or verbal greetings only. . Avoid touching your eyes, nose, or mouth with unwashed hands.  . Avoid close contact with people who are sick. . Avoid places or events with large numbers of people in one location, like concerts or sporting events. . If you have some symptoms but not all symptoms, continue to monitor at home and seek medical attention if your symptoms worsen. . If you are having a medical emergency, call 911.   ADDITIONAL HEALTHCARE OPTIONS FOR PATIENTS  [facility] / e-Visit: [email or web address]         [facility]: [phone]  [facility]: [phone]                   [facility]: [phone]     It is flu season:   >>> Best ways to protect herself from the flu: Receive the yearly flu vaccine, practice good hand hygiene washing with soap and also using hand sanitizer when available, eat a nutritious meals, get adequate rest, hydrate appropriately   Please contact the office if your symptoms worsen or you have concerns that you are not improving.   Thank you for choosing [facility] for  your healthcare, and for allowing [place] to partner with you on your healthcare journey. I am thankful to be able to provide care to you today.   Wyn Quaker FNP-C

## 2020-06-11 NOTE — Assessment & Plan Note (Signed)
Congratulated patient on stopping smoking  Plan: Continue not smoke

## 2020-06-11 NOTE — Assessment & Plan Note (Signed)
Split-night sleep study shows severe obstructive sleep apnea with an AHI of 47 CPAP compliance report shows adequate control Patient recently started CPAP therapy 7 days ago  Plan: 35-month follow-up with our office to show with at least 30-day compliance between day 31 of starting CPAP and day 89 Patient to establish care with Dr. Val Eagle and 30-minute time slot

## 2020-06-11 NOTE — Assessment & Plan Note (Signed)
Evaluated by hematology in June/2021 Recommending lifelong anticoagulation Follow-up with hematology planned in March/2022  Plan: Continue follow-up with hematology Continue Eliquis

## 2020-06-11 NOTE — Progress Notes (Signed)
@Patient  ID: , male    DOB: 03/02/1986, 34 y.o.   MRN: [id number]  Chief Complaint  Patient presents with   Follow-up    pt is here for cpap compliance.pt is here to discuss echo and ct of leg    Referring provider: [id number], DO  HPI:  34 year old male current everyday smoker initially consulted with our practice on 02/21/2020 when admitted for a large saddle pulmonary embolism. Later found to have severe OSA.  PMH: Obesity Smoker/ Smoking History: Current Smoker.  Smoking 2 cigarettes a day Maintenance:  none Pt of: Needs outpatient pulmonologist  06/11/2020  - Visit   34 year old male former smoker on our office for large saddle PE.  Patient completing follow-up with our office today.  Patient was last seen in May/2021.  Is recommended at that office visit that he continue Eliquis.  He work on stopping smoking.  He obtain repeat echocardiogram as well as a lower extremity Doppler.  Patient was referred to primary care and referred to hematology.  Patient was requested to establish care with Dr. Jun/2021  05/30/2020-echocardiogram-ejection fraction 60 to 65%, right ventricular systolic function is low normal, difficult to fully visualize windows  05/24/2020-lower extremity Dopplers as shows improvement, continue Eliquis  Patient was seen in the emergency room on 05/23/2020 for epistasis recommended that he start taking Zyrtec. Pt recently started CPAP therapy for severe osa. Pt feels that it CPAP is already started to help with patient sleep quality.  He feels that he is waking up with less daytime sleepiness and fatigue.  CPAP compliant report shows excellent AHI control over for 7 days.  See the compliance were listed below:  06/03/2020-06/09/2020-CPAP compliance report-7 out of last 7 days used, average usage 7 hours and 40 minutes, CPAP set pressure of 13, AHI 1  Patient has also completed follow-up with hematology they have recommended lifelong anticoagulation and  follow-up with them in March/2021.  Patient is also successfully stopped smoking.  Questionaires / Pulmonary Flowsheets:   ACT:  No flowsheet data found.  MMRC: No flowsheet data found.  Epworth:  No flowsheet data found.  Tests:   02/22/2020-IR angiogram-negative for right iliac or inferior vena cava thrombus, bilateral central pulmonary emboli with markedly elevated pulmonary arterial pressures, good response to selective right and left pulmonary artery thrombectomy with near normalization and pulmonary artery pressures  02/22/2020-lower extremity Doppler-unable to visualize right CFV secondary to bandages, left findings consistent with acute DVT involving left popliteal vein  FENO:  No results found for: NITRICOXIDE  PFT: No flowsheet data found.  WALK:  SIX MIN WALK 03/07/2020  Supplimental Oxygen during Test? (L/min) No  Tech Comments: tolerated well, walked slow to moderate pace, no desat or shortness of breath    Imaging: ECHOCARDIOGRAM COMPLETE  Result Date: 05/30/2020    ECHOCARDIOGRAM REPORT   Patient Name:   Riley Wells Date of Exam: 05/30/2020 Medical Rec #:  06/01/2020         Height:       74.0 in Accession #:    [id number]        Weight:       380.0 lb Date of Birth:  31-Oct-1985         BSA:          2.855 m Patient Age:    33 years          BP:           157/111 mmHg Patient Gender:  M                 HR:           94 bpm. Exam Location:  [address] Procedure: 2D Echo, Cardiac Doppler, Color Doppler and Intracardiac            Opacification Agent Indications:    I26.99 Pulmonary Embolus  History:        Patient has no prior history of Echocardiogram examinations.                 Risk Factors:Morbid Obesity and Hypertension.  Sonographer:    Sedonia Small Rodgers-Jones RDCS Referring Phys: [phone] Antasia Haider P Vertis Scheib IMPRESSIONS  1. Left ventricular ejection fraction, by estimation, is 60 to 65%. The left ventricle has normal function. The left ventricle has no regional wall  motion abnormalities. Left ventricular diastolic parameters were normal.  2. Right ventricular systolic function is low normal. The right ventricular size is normal.  3. The mitral valve is grossly normal. No evidence of mitral valve regurgitation. No evidence of mitral stenosis.  4. The aortic valve is grossly normal. Aortic valve regurgitation is not visualized. No aortic stenosis is present. Comparison(s): No prior Echocardiogram. Conclusion(s)/Recommendation(s): Difficult windows. Normal LVEF. RV function with contrast appears to be low normal, grossly normal RV size. FINDINGS  Left Ventricle: Left ventricular ejection fraction, by estimation, is 60 to 65%. The left ventricle has normal function. The left ventricle has no regional wall motion abnormalities. Definity contrast agent was given IV to delineate the left ventricular  endocardial borders. The left ventricular internal cavity size was small. There is no left ventricular hypertrophy. Left ventricular diastolic parameters were normal. Right Ventricle: The right ventricular size is normal. No increase in right ventricular wall thickness. Right ventricular systolic function is low normal. Left Atrium: Left atrial size was normal in size. Right Atrium: Right atrial size was normal in size. Pericardium: There is no evidence of pericardial effusion. Mitral Valve: The mitral valve is grossly normal. No evidence of mitral valve regurgitation. No evidence of mitral valve stenosis. Tricuspid Valve: The tricuspid valve is grossly normal. Tricuspid valve regurgitation is not demonstrated. No evidence of tricuspid stenosis. Aortic Valve: The aortic valve is grossly normal. Aortic valve regurgitation is not visualized. No aortic stenosis is present. Pulmonic Valve: The pulmonic valve was not well visualized. Pulmonic valve regurgitation is not visualized. Aorta: The aortic root, ascending aorta and aortic arch are all structurally normal, with no evidence of  dilitation or obstruction. Venous: The inferior vena cava was not well visualized. IAS/Shunts: The atrial septum is grossly normal.  LEFT VENTRICLE PLAX 2D LVIDd:         5.60 cm  Diastology LVIDs:         3.60 cm  LV e' lateral:   13.40 cm/s LV PW:         0.90 cm  LV E/e' lateral: 4.4 LV IVS:        0.90 cm  LV e' medial:    8.08 cm/s LVOT diam:     2.30 cm  LV E/e' medial:  7.4 LV SV:         87 LV SV Index:   30 LVOT Area:     4.15 cm  RIGHT VENTRICLE RV Basal diam:  3.70 cm RV S prime:     15.50 cm/s TAPSE (M-mode): 1.7 cm LEFT ATRIUM             Index  RIGHT ATRIUM           Index LA diam:        4.40 cm 1.54 cm/m  RA Area:     15.80 cm LA Vol (A2C):   50.1 ml 17.55 ml/m RA Volume:   42.60 ml  14.92 ml/m LA Vol (A4C):   47.8 ml 16.74 ml/m LA Biplane Vol: 51.0 ml 17.86 ml/m  AORTIC VALVE LVOT Vmax:   100.00 cm/s LVOT Vmean:  76.100 cm/s LVOT VTI:    0.209 m  AORTA Ao Root diam: 3.70 cm MITRAL VALVE MV Area (PHT): 3.42 cm    SHUNTS MV Decel Time: 222 msec    Systemic VTI:  0.21 m MV E velocity: 59.60 cm/s  Systemic Diam: 2.30 cm MV A velocity: 81.80 cm/s MV E/A ratio:  0.73 Jodelle Red MD Electronically signed by Jodelle Red MD Signature Date/Time: 05/30/2020/11:11:36 PM    Final    VAS [place] LOWER EXTREMITY VENOUS (DVT)  Result Date: 05/25/2020  Lower Venous DVTStudy Indications: Follow up left popliteal vein DVT. Other Indications: S/p selective right and left pulmonary arterial thrombectomy                    02/21/20. Risk Factors: History May 2021 DVT 02/22/20 left popliteal, PTV and peroneal veins. Prior DVT in 2018 after a long car trip. obesity. Anticoagulation: Eliquis. Comparison Study: 02/22/20 Performing Technologist: Vella Kohler BS, RVT, RDCS  Examination Guidelines: A complete evaluation includes B-mode imaging, spectral Doppler, color Doppler, and power Doppler as needed of all accessible portions of each vessel. Bilateral testing is considered an integral part of  a complete examination. Limited examinations for reoccurring indications may be performed as noted. The reflux portion of the exam is performed with the patient in reverse Trendelenburg.  +---------+---------------+---------+-----------+----------+--------------+  LEFT      Compressibility Phasicity Spontaneity Properties Thrombus Aging  +---------+---------------+---------+-----------+----------+--------------+  CFV       Full            Yes       Yes                                    +---------+---------------+---------+-----------+----------+--------------+  SFJ       Full            Yes       Yes                                    +---------+---------------+---------+-----------+----------+--------------+  FV Prox   Full            Yes       Yes                                    +---------+---------------+---------+-----------+----------+--------------+  FV Mid    Full            Yes       Yes                                    +---------+---------------+---------+-----------+----------+--------------+  FV Distal Full            Yes       Yes                                    +---------+---------------+---------+-----------+----------+--------------+  PFV       Full            Yes       Yes                                    +---------+---------------+---------+-----------+----------+--------------+  POP       Full            Yes       Yes                                    +---------+---------------+---------+-----------+----------+--------------+  PTV       Full            Yes       Yes                                    +---------+---------------+---------+-----------+----------+--------------+  PERO      Full            Yes       Yes                                    +---------+---------------+---------+-----------+----------+--------------+  Gastroc   Full            Yes       Yes                                    +---------+---------------+---------+-----------+----------+--------------+  GSV        Full            Yes       Yes                                    +---------+---------------+---------+-----------+----------+--------------+ No evidence of left lower extremity DVT. Poorly visualized peroneal veins secondary to depth.    Summary: LEFT: - Findings appear improved from previous examination. Findings suggest resolution of previously noted thrombus. - There is no evidence of deep vein thrombosis in the lower extremity. - There is no evidence of superficial venous thrombosis.  *See table(s) above for measurements and observations. Electronically signed by Lance Muss MD on 05/25/2020 at 5:04:18 PM.    Final     Lab Results:  CBC    Component Value Date/Time   WBC 11.1 (H) 02/22/2020 0247   RBC 4.72 02/22/2020 0247   HGB 13.7 02/22/2020 0247   HCT 43.0 02/22/2020 0247   PLT 197 02/22/2020 0247   MCV 91.1 02/22/2020 0247   MCH 29.0 02/22/2020 0247   MCHC 31.9 02/22/2020 0247   RDW 12.8 02/22/2020 0247   LYMPHSABS 4.9 (H) 02/21/2020 1317   MONOABS 0.8 02/21/2020 1317   EOSABS 0.2 02/21/2020 1317   BASOSABS 0.1 02/21/2020 1317    BMET    Component Value Date/Time   NA 137 02/23/2020 0429   K 3.9 02/23/2020 0429   CL 107 02/23/2020 0429   CO2 22 02/23/2020 0429   GLUCOSE 113 (H) 02/23/2020 0429   BUN 16 02/23/2020 0429  CREATININE 1.09 02/23/2020 0429   CALCIUM 9.0 02/23/2020 0429   GFRNONAA >60 02/23/2020 0429   GFRAA >60 02/23/2020 0429    BNP    Component Value Date/Time   BNP 205.9 (H) 02/21/2020 1317    ProBNP No results found for: PROBNP  Specialty Problems      Pulmonary Problems   Severe obstructive sleep apnea    Stop bang score 7         No Known Allergies   There is no immunization history on file for this patient.  History reviewed. No pertinent past medical history.  Tobacco History: Social History   Tobacco Use  Smoking Status Former Smoker   Packs/day: 1.00   Types: Cigarettes   Start date: 2001   Quit date:  05/21/2020   Years since quitting: 0.0  Smokeless Tobacco Never Used   Counseling given: Not Answered   Continue to not smoke  Outpatient Encounter Medications as of 06/11/2020  Medication Sig   apixaban (ELIQUIS) 5 MG TABS tablet Take 1 tablet (5 mg total) by mouth 2 (two) times daily. Start after completing the Eliquis Starter Pack   APIXABAN (ELIQUIS) VTE STARTER PACK (10MG  AND 5MG ) Take as directed on package: start with two-5mg  tablets twice daily for 7 days. On day 8 (May 21), switch to one-5mg  tablet twice daily.   hydrochlorothiazide (HYDRODIURIL) 25 MG tablet Take 1 tablet (25 mg total) by mouth daily.   No facility-administered encounter medications on file as of 06/11/2020.     Review of Systems  Review of Systems  Constitutional: Negative for activity change, chills, fatigue, fever and unexpected weight change.  HENT: Negative for postnasal drip, rhinorrhea, sinus pressure, sinus pain and sore throat.   Eyes: Negative.   Respiratory: Negative for cough, shortness of breath and wheezing.   Cardiovascular: Negative for chest pain and palpitations.  Gastrointestinal: Negative for constipation, diarrhea, nausea and vomiting.  Endocrine: Negative.   Genitourinary: Negative.   Musculoskeletal: Negative.   Skin: Negative.   Neurological: Negative for dizziness and headaches.  Psychiatric/Behavioral: Negative.  Negative for dysphoric mood. The patient is not nervous/anxious.   All other systems reviewed and are negative.    Physical Exam  BP 128/82 (BP Location: Left Arm, Cuff Size: Normal)    Pulse 80    Temp 98.2 F (36.8 C) (Oral)    Ht 6\' 2"  (1.88 m)    Wt (!) 380 lb 6.4 oz (172.5 kg)    SpO2 98%    BMI 48.84 kg/m   Wt Readings from Last 5 Encounters:  06/11/20 (!) 380 lb 6.4 oz (172.5 kg)  03/27/20 (!) 380 lb (172.4 kg)  03/21/20 (!) 381 lb 1.6 oz (172.9 kg)  03/07/20 (!) 381 lb (172.8 kg)  02/21/20 (!) 370 lb 9.5 oz (168.1 kg)    BMI Readings from Last 5  Encounters:  06/11/20 48.84 kg/m  03/27/20 48.79 kg/m  03/21/20 48.28 kg/m  03/07/20 48.26 kg/m  02/21/20 46.32 kg/m     Physical Exam Vitals and nursing note reviewed.  Constitutional:      General: He is not in acute distress.    Appearance: Normal appearance. He is obese.  HENT:     Head: Normocephalic and atraumatic.     Right Ear: Hearing and external ear normal.     Left Ear: Hearing and external ear normal.     Nose: Nose normal. No mucosal edema or rhinorrhea.     Right Turbinates: Not enlarged.  Left Turbinates: Not enlarged.     Mouth/Throat:     Mouth: Mucous membranes are dry.     Pharynx: Oropharynx is clear. No oropharyngeal exudate.  Eyes:     Pupils: Pupils are equal, round, and reactive to light.  Cardiovascular:     Rate and Rhythm: Normal rate and regular rhythm.     Pulses: Normal pulses.     Heart sounds: Normal heart sounds. No murmur heard.   Pulmonary:     Effort: Pulmonary effort is normal.     Breath sounds: Normal breath sounds. No decreased breath sounds, wheezing or rales.  Musculoskeletal:     Cervical back: Normal range of motion.     Right lower leg: No edema.     Left lower leg: No edema.  Lymphadenopathy:     Cervical: No cervical adenopathy.  Skin:    General: Skin is warm and dry.     Capillary Refill: Capillary refill takes less than 2 seconds.     Findings: No erythema or rash.  Neurological:     General: No focal deficit present.     Mental Status: He is alert and oriented to person, place, and time.     Motor: No weakness.     Coordination: Coordination normal.     Gait: Gait is intact. Gait normal.  Psychiatric:        Mood and Affect: Mood normal.        Behavior: Behavior normal. Behavior is cooperative.        Thought Content: Thought content normal.        Judgment: Judgment normal.       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 lifelong  anticoagulation Follow-up with hematology planned in March/2022  Plan: Continue follow-up with hematology Continue Eliquis  Pulmonary HTN (HCC) Low normal RV function on recent echocardiogram  Suspect this is related to patient's severe obstructive sleep apnea which is recently started to obtain treatment  Plan: Continue CPAP therapy May need to consider repeating echocardiogram in 1 year or sooner if patient's clinical symptoms worsen  Severe obstructive sleep apnea Split-night sleep study shows severe obstructive sleep apnea with an AHI of 47 CPAP compliance report shows adequate control Patient recently started CPAP therapy 7 days ago  Plan: 38-month follow-up with our office to show with at least 30-day compliance between day 31 of starting CPAP and day 89 Patient to establish care with Dr. Val Eagle and 30-minute time slot  History of pulmonary embolus (PE) Plan: Continue Eliquis  History of DVT (deep vein thrombosis) Plan: Continue Eliquis  Former smoker Congratulated patient on stopping smoking  Plan: Continue not smoke    Return in about 2 months (around 08/11/2020), or if symptoms worsen or fail to improve, for Follow up with Dr. Wynona Neat, 30 MINUTE SLOT.   Coral Ceo, NP 06/11/2020   This appointment required 32 minutes of patient care (this includes precharting, chart review, review of results, face-to-face care, etc.).

## 2020-06-11 NOTE — Assessment & Plan Note (Signed)
Plan: Continue Eliquis 

## 2020-06-11 NOTE — Assessment & Plan Note (Signed)
Low normal RV function on recent echocardiogram  Suspect this is related to patient's severe obstructive sleep apnea which is recently started to obtain treatment  Plan: Continue CPAP therapy May need to consider repeating echocardiogram in 1 year or sooner if patient's clinical symptoms worsen

## 2020-12-19 ENCOUNTER — Telehealth: Payer: Self-pay

## 2020-12-19 ENCOUNTER — Inpatient Hospital Stay: Payer: Self-pay | Attending: Oncology | Admitting: Oncology

## 2020-12-19 NOTE — Telephone Encounter (Signed)
TCT patient regarding missed MD visit at 3:30 pm today, no response, LVM.

## 2021-05-16 ENCOUNTER — Emergency Department (HOSPITAL_COMMUNITY): Payer: BC Managed Care – PPO

## 2021-05-16 ENCOUNTER — Observation Stay (HOSPITAL_COMMUNITY)
Admission: EM | Admit: 2021-05-16 | Discharge: 2021-05-16 | Disposition: A | Discharge status: Home / Self Care | Payer: BC Managed Care – PPO | Attending: Internal Medicine | Admitting: Internal Medicine

## 2021-05-16 ENCOUNTER — Encounter (HOSPITAL_COMMUNITY): Payer: Self-pay | Admitting: Emergency Medicine

## 2021-05-16 ENCOUNTER — Other Ambulatory Visit: Payer: Self-pay

## 2021-05-16 DIAGNOSIS — N179 Acute kidney failure, unspecified: Secondary | ICD-10-CM | POA: Diagnosis present

## 2021-05-16 DIAGNOSIS — Z87891 Personal history of nicotine dependence: Secondary | ICD-10-CM | POA: Insufficient documentation

## 2021-05-16 DIAGNOSIS — Z86711 Personal history of pulmonary embolism: Secondary | ICD-10-CM | POA: Diagnosis not present

## 2021-05-16 DIAGNOSIS — R519 Headache, unspecified: Secondary | ICD-10-CM | POA: Diagnosis present

## 2021-05-16 DIAGNOSIS — G4733 Obstructive sleep apnea (adult) (pediatric): Secondary | ICD-10-CM

## 2021-05-16 DIAGNOSIS — R0602 Shortness of breath: Secondary | ICD-10-CM | POA: Insufficient documentation

## 2021-05-16 DIAGNOSIS — Z9989 Dependence on other enabling machines and devices: Secondary | ICD-10-CM

## 2021-05-16 DIAGNOSIS — Z7901 Long term (current) use of anticoagulants: Secondary | ICD-10-CM | POA: Diagnosis not present

## 2021-05-16 DIAGNOSIS — I2693 Single subsegmental pulmonary embolism without acute cor pulmonale: Secondary | ICD-10-CM | POA: Diagnosis not present

## 2021-05-16 DIAGNOSIS — Z79899 Other long term (current) drug therapy: Secondary | ICD-10-CM | POA: Insufficient documentation

## 2021-05-16 DIAGNOSIS — U071 COVID-19: Principal | ICD-10-CM | POA: Diagnosis present

## 2021-05-16 DIAGNOSIS — E860 Dehydration: Secondary | ICD-10-CM | POA: Diagnosis not present

## 2021-05-16 DIAGNOSIS — E66813 Obesity, class 3: Secondary | ICD-10-CM | POA: Diagnosis present

## 2021-05-16 DIAGNOSIS — Z86718 Personal history of other venous thrombosis and embolism: Secondary | ICD-10-CM

## 2021-05-16 DIAGNOSIS — E871 Hypo-osmolality and hyponatremia: Secondary | ICD-10-CM | POA: Diagnosis present

## 2021-05-16 HISTORY — DX: Personal history of other venous thrombosis and embolism: Z86.718

## 2021-05-16 HISTORY — DX: Other pulmonary embolism without acute cor pulmonale: I26.99

## 2021-05-16 LAB — URINALYSIS, ROUTINE W REFLEX MICROSCOPIC
Bacteria, UA: NONE SEEN
Bilirubin Urine: NEGATIVE
Glucose, UA: NEGATIVE mg/dL
Ketones, ur: NEGATIVE mg/dL
Leukocytes,Ua: NEGATIVE
Nitrite: NEGATIVE
Protein, ur: 30 mg/dL — AB
Specific Gravity, Urine: 1.031 — ABNORMAL HIGH (ref 1.005–1.030)
pH: 5 (ref 5.0–8.0)

## 2021-05-16 LAB — CBC WITH DIFFERENTIAL/PLATELET
Abs Immature Granulocytes: 0.05 10*3/uL (ref 0.00–0.07)
Basophils Absolute: 0 10*3/uL (ref 0.0–0.1)
Basophils Relative: 1 %
Eosinophils Absolute: 0 10*3/uL (ref 0.0–0.5)
Eosinophils Relative: 0 %
HCT: 41.7 % (ref 39.0–52.0)
Hemoglobin: 13.3 g/dL (ref 13.0–17.0)
Immature Granulocytes: 1 %
Lymphocytes Relative: 19 %
Lymphs Abs: 1.1 10*3/uL (ref 0.7–4.0)
MCH: 28.9 pg (ref 26.0–34.0)
MCHC: 31.9 g/dL (ref 30.0–36.0)
MCV: 90.7 fL (ref 80.0–100.0)
Monocytes Absolute: 0.8 10*3/uL (ref 0.1–1.0)
Monocytes Relative: 13 %
Neutro Abs: 4 10*3/uL (ref 1.7–7.7)
Neutrophils Relative %: 66 %
Platelets: 194 10*3/uL (ref 150–400)
RBC: 4.6 MIL/uL (ref 4.22–5.81)
RDW: 13.4 % (ref 11.5–15.5)
WBC: 5.9 10*3/uL (ref 4.0–10.5)
nRBC: 0 % (ref 0.0–0.2)

## 2021-05-16 LAB — COMPREHENSIVE METABOLIC PANEL
ALT: 40 U/L (ref 0–44)
AST: 29 U/L (ref 15–41)
Albumin: 4.1 g/dL (ref 3.5–5.0)
Alkaline Phosphatase: 50 U/L (ref 38–126)
Anion gap: 11 (ref 5–15)
BUN: 17 mg/dL (ref 6–20)
CO2: 24 mmol/L (ref 22–32)
Calcium: 9.2 mg/dL (ref 8.9–10.3)
Chloride: 99 mmol/L (ref 98–111)
Creatinine, Ser: 1.39 mg/dL — ABNORMAL HIGH (ref 0.61–1.24)
GFR, Estimated: >60 mL/min (ref >60)
Glucose, Bld: 119 mg/dL — ABNORMAL HIGH (ref 70–99)
Potassium: 3.6 mmol/L (ref 3.5–5.1)
Sodium: 134 mmol/L — ABNORMAL LOW (ref 135–145)
Total Bilirubin: 1.1 mg/dL (ref 0.3–1.2)
Total Protein: 7.7 g/dL (ref 6.5–8.1)

## 2021-05-16 LAB — RESP PANEL BY RT-PCR (FLU A&B, COVID) ARPGX2
Influenza A by PCR: NEGATIVE
Influenza B by PCR: NEGATIVE
SARS Coronavirus 2 by RT PCR: POSITIVE — AB

## 2021-05-16 LAB — BRAIN NATRIURETIC PEPTIDE: B Natriuretic Peptide: 43.5 pg/mL (ref 0.0–100.0)

## 2021-05-16 LAB — D-DIMER, QUANTITATIVE: D-Dimer, Quant: 0.78 ug/mL-FEU — ABNORMAL HIGH (ref 0.00–0.50)

## 2021-05-16 LAB — TROPONIN I (HIGH SENSITIVITY)
Troponin I (High Sensitivity): 5 ng/L (ref <18)
Troponin I (High Sensitivity): 5 ng/L (ref <18)

## 2021-05-16 MED ORDER — APIXABAN 5 MG PO TABS
5.0000 mg | ORAL_TABLET | Freq: Two times a day (BID) | ORAL | Status: DC
  Administered 2021-05-16: 5 mg via ORAL
  Filled 2021-05-16: qty 1

## 2021-05-16 MED ORDER — IBUPROFEN 800 MG PO TABS
800.0000 mg | ORAL_TABLET | Freq: Once | ORAL | Status: AC
  Administered 2021-05-16: 800 mg via ORAL
  Filled 2021-05-16: qty 1

## 2021-05-16 MED ORDER — HEPARIN BOLUS VIA INFUSION
3000.0000 [IU] | Freq: Once | INTRAVENOUS | Status: DC
  Filled 2021-05-16: qty 3000

## 2021-05-16 MED ORDER — ALBUTEROL SULFATE HFA 108 (90 BASE) MCG/ACT IN AERS: 2.0000 | INHALATION_SPRAY | Freq: Once | RESPIRATORY_TRACT | Status: AC | PRN

## 2021-05-16 MED ORDER — ZINC SULFATE 220 (50 ZN) MG PO CAPS: 220.0000 mg | ORAL_CAPSULE | Freq: Every day | ORAL | Status: DC

## 2021-05-16 MED ORDER — ACETAMINOPHEN 325 MG PO TABS
650.0000 mg | ORAL_TABLET | Freq: Four times a day (QID) | ORAL | Status: DC | PRN
  Administered 2021-05-16: 650 mg via ORAL
  Filled 2021-05-16: qty 2

## 2021-05-16 MED ORDER — BEBTELOVIMAB 175 MG/2 ML IV (EUA)
175.0000 mg | Freq: Once | INTRAMUSCULAR | Status: AC
  Administered 2021-05-16: 175 mg via INTRAVENOUS
  Filled 2021-05-16: qty 2

## 2021-05-16 MED ORDER — SODIUM CHLORIDE 0.9 % IV BOLUS
1000.0000 mL | Freq: Once | INTRAVENOUS | Status: AC
  Administered 2021-05-16: 1000 mL via INTRAVENOUS

## 2021-05-16 MED ORDER — ACETAMINOPHEN 325 MG PO TABS: 650.0000 mg | ORAL_TABLET | Freq: Four times a day (QID) | ORAL | 0 refills | Status: DC | PRN

## 2021-05-16 MED ORDER — ZINC SULFATE 220 (50 ZN) MG PO CAPS
220.0000 mg | ORAL_CAPSULE | Freq: Every day | ORAL | Status: DC
  Administered 2021-05-16: 220 mg via ORAL
  Filled 2021-05-16: qty 1

## 2021-05-16 MED ORDER — ALBUTEROL SULFATE (2.5 MG/3ML) 0.083% IN NEBU
2.5000 mg | INHALATION_SOLUTION | Freq: Four times a day (QID) | RESPIRATORY_TRACT | Status: DC
  Administered 2021-05-16: 2.5 mg via RESPIRATORY_TRACT
  Filled 2021-05-16: qty 3

## 2021-05-16 MED ORDER — SODIUM CHLORIDE 0.9% FLUSH
3.0000 mL | Freq: Two times a day (BID) | INTRAVENOUS | Status: DC
  Administered 2021-05-16: 3 mL via INTRAVENOUS

## 2021-05-16 MED ORDER — HEPARIN (PORCINE) 25000 UT/250ML-% IV SOLN: 2100.0000 [IU]/h | INTRAVENOUS | Status: DC

## 2021-05-16 MED ORDER — ASCORBIC ACID 500 MG PO TABS
500.0000 mg | ORAL_TABLET | Freq: Every day | ORAL | Status: DC
  Administered 2021-05-16: 500 mg via ORAL
  Filled 2021-05-16: qty 1

## 2021-05-16 MED ORDER — GUAIFENESIN 100 MG/5ML PO SOLN: 5.0000 mL | ORAL | 0 refills | Status: DC | PRN

## 2021-05-16 MED ORDER — EPINEPHRINE 0.3 MG/0.3ML IJ SOAJ: 0.3000 mg | Freq: Once | INTRAMUSCULAR | Status: DC | PRN

## 2021-05-16 MED ORDER — SODIUM CHLORIDE 0.9 % IV SOLN
INTRAVENOUS | Status: DC | PRN
  Administered 2021-05-16: 500 mL via INTRAVENOUS

## 2021-05-16 MED ORDER — ONDANSETRON HCL 4 MG/2ML IJ SOLN
4.0000 mg | Freq: Four times a day (QID) | INTRAMUSCULAR | Status: DC | PRN
  Filled 2021-05-16: qty 2

## 2021-05-16 MED ORDER — DIPHENHYDRAMINE HCL 50 MG/ML IJ SOLN: 50.0000 mg | Freq: Once | INTRAMUSCULAR | Status: DC | PRN

## 2021-05-16 MED ORDER — GUAIFENESIN-DM 100-10 MG/5ML PO SYRP: 10.0000 mL | ORAL_SOLUTION | ORAL | Status: DC | PRN

## 2021-05-16 MED ORDER — FAMOTIDINE IN NACL 20-0.9 MG/50ML-% IV SOLN: 20.0000 mg | Freq: Once | INTRAVENOUS | Status: DC | PRN

## 2021-05-16 MED ORDER — IOHEXOL 350 MG/ML SOLN
100.0000 mL | Freq: Once | INTRAVENOUS | Status: AC | PRN
  Administered 2021-05-16: 100 mL via INTRAVENOUS

## 2021-05-16 MED ORDER — ASCORBIC ACID 500 MG PO TABS: 500.0000 mg | ORAL_TABLET | Freq: Every day | ORAL | Status: DC

## 2021-05-16 MED ORDER — KETOROLAC TROMETHAMINE 15 MG/ML IJ SOLN
15.0000 mg | Freq: Once | INTRAMUSCULAR | Status: AC
  Administered 2021-05-16: 15 mg via INTRAVENOUS
  Filled 2021-05-16: qty 1

## 2021-05-16 MED ORDER — ALBUTEROL SULFATE HFA 108 (90 BASE) MCG/ACT IN AERS
2.0000 | INHALATION_SPRAY | Freq: Once | RESPIRATORY_TRACT | Status: DC | PRN
  Administered 2021-05-16: 2 via RESPIRATORY_TRACT
  Filled 2021-05-16: qty 6.7

## 2021-05-16 MED ORDER — HYDROCOD POLST-CPM POLST ER 10-8 MG/5ML PO SUER
5.0000 mL | Freq: Two times a day (BID) | ORAL | Status: DC | PRN
Start: 2021-05-16 — End: 2021-05-16

## 2021-05-16 MED ORDER — ONDANSETRON HCL 4 MG PO TABS: 4.0000 mg | ORAL_TABLET | Freq: Four times a day (QID) | ORAL | Status: DC | PRN

## 2021-05-16 MED ORDER — METHYLPREDNISOLONE SODIUM SUCC 125 MG IJ SOLR: 125.0000 mg | Freq: Once | INTRAMUSCULAR | Status: DC | PRN

## 2021-05-16 MED ORDER — ACETAMINOPHEN 500 MG PO TABS: 500.0000 mg | ORAL_TABLET | Freq: Four times a day (QID) | ORAL | Status: DC | PRN

## 2021-05-16 NOTE — ED Triage Notes (Signed)
Patient reports fever with headache and fatigue onset yesterday .

## 2021-05-16 NOTE — Consult Note (Signed)
[facility] CONSULT NOTE  Patient Care Team: Montez Hageman, DO as PCP - General (Family Medicine)  ASSESSMENT & PLAN:   History of recurrent DVT/PE I have personally reviewed his CT imaging and reviewed his blood work I am not convinced the abnormal changes in the right lower lobe is a new blood clot His D Dimer is only marginally elevated I recommend he continues Eliquis We discussed importance of medication compliance and increase oral fluid intake I will inform Dr. Clelia Croft of his ER visit and arrange follow-up with him  Dyspnea, Covid-19 infection I would defer management to hospitalist  Elevated creatinine Likely due to dehydration I recommend increase oral fluid intake  Discharge planning Defer to primary service, he can be discharged from my stand point  The total time spent in the appointment was 55 minutes encounter with patients including review of chart and various tests results, discussions about plan of care and coordination of care plan   All questions were answered. The patient knows to call the clinic with any problems, questions or concerns. No barriers to learning was detected.  Artis Delay, MD 8/5/20225:24 PM  CHIEF COMPLAINTS/PURPOSE OF CONSULTATION:  History of DVT and PE  HISTORY OF PRESENTING ILLNESS:  Riley Wells 35 y.o. male is at the request from hospitalist He had remote history of DVT, completed 6 months of anticoagulation therapy. Then last year, he had saddle embolism, s/p thrombectomy. He is maintained on lifelong treatment with Eliquis. He saw Dr. Clelia Croft last year. He has strong family history of blood clots.  He was seen in the ER. Has complaints of fever and fatigue. Was found to be COVID-19 positive. He had blood work which showed mildly elevated D-Dimer. CT angiogram was hard to interpret, raises question whether he might had right lower lobe embolism. He denies chest pain. He missed about 1 week of treatment last month  due to loss of insurance but has been compliant with treatment since then. No bleeding complications. No calf pain.  MEDICAL HISTORY:  Past Medical History:  Diagnosis Date   History of DVT (deep vein thrombosis)    PE (pulmonary thromboembolism) (HCC)     SURGICAL HISTORY: Past Surgical History:  Procedure Laterality Date   IR ANGIOGRAM PULMONARY BILATERAL SELECTIVE  02/21/2020   IR ANGIOGRAM SELECTIVE EACH ADDITIONAL VESSEL  02/21/2020   IR ANGIOGRAM SELECTIVE EACH ADDITIONAL VESSEL  02/21/2020   IR THROMBECT VENO MECH MOD SED  02/21/2020   IR US GUIDE VASC ACCESS RIGHT  02/21/2020    SOCIAL HISTORY: Social History   Socioeconomic History   Marital status: Married    Spouse name: Not on file   Number of children: Not on file   Years of education: Not on file   Highest education level: Not on file  Occupational History   Not on file  Tobacco Use   Smoking status: Former    Packs/day: 1.00    Types: Cigarettes    Start date: 2001    Quit date: 05/21/2020    Years since quitting: 0.9   Smokeless tobacco: Never  Vaping Use   Vaping Use: Former   Quit date: 01/29/2020  Substance and Sexual Activity   Alcohol use: Never   Drug use: Never   Sexual activity: Not on file  Other Topics Concern   Not on file  Social History Narrative   Not on file   Social Determinants of Health   Financial Resource Strain: Not on file  Food  Insecurity: Not on file  Transportation Needs: Not on file  Physical Activity: Not on file  Stress: Not on file  Social Connections: Not on file  Intimate Partner Violence: Not on file    FAMILY HISTORY: History reviewed. No pertinent family history.  ALLERGIES:  has No Known Allergies.  MEDICATIONS:  Current Facility-Administered Medications  Medication Dose Route Frequency Provider Last Rate Last Admin   0.9 %  sodium chloride infusion   Intravenous PRN Tanda Rockers A, DO 10 mL/hr at 05/16/21 1523 500 mL at 05/16/21 1523   acetaminophen  (TYLENOL) tablet 650 mg  650 mg Oral Q6H PRN Clydie Braun, MD   650 mg at 05/16/21 1605   albuterol (PROVENTIL) (2.5 MG/3ML) 0.083% nebulizer solution 2.5 mg  2.5 mg Inhalation Q6H Jarosz, Rondell A, MD   2.5 mg at 05/16/21 1605   albuterol (VENTOLIN HFA) 108 (90 Base) MCG/ACT inhaler 2 puff  2 puff Inhalation Once PRN Tanda Rockers A, DO       apixaban (ELIQUIS) tablet 5 mg  5 mg Oral BID Madelyn Flavors A, MD   5 mg at 05/16/21 1439   ascorbic acid (VITAMIN C) tablet 500 mg  500 mg Oral Daily Madelyn Flavors A, MD   500 mg at 05/16/21 1605   chlorpheniramine-HYDROcodone (TUSSIONEX) 10-8 MG/5ML suspension 5 mL  5 mL Oral Q12H PRN Madelyn Flavors A, MD       diphenhydrAMINE (BENADRYL) injection 50 mg  50 mg Intravenous Once PRN Tanda Rockers A, DO       EPINEPHrine (EPI-PEN) injection 0.3 mg  0.3 mg Intramuscular Once PRN Tanda Rockers A, DO       famotidine (PEPCID) IVPB 20 mg premix  20 mg Intravenous Once PRN Tanda Rockers A, DO       guaiFENesin-dextromethorphan (ROBITUSSIN DM) 100-10 MG/5ML syrup 10 mL  10 mL Oral Q4H PRN Madelyn Flavors A, MD       methylPREDNISolone sodium succinate (SOLU-MEDROL) 125 mg/2 mL injection 125 mg  125 mg Intravenous Once PRN Tanda Rockers A, DO       ondansetron (ZOFRAN) tablet 4 mg  4 mg Oral Q6H PRN Madelyn Flavors A, MD       Or   ondansetron (ZOFRAN) injection 4 mg  4 mg Intravenous Q6H PRN Mccranie, Rondell A, MD       sodium chloride flush (NS) 0.9 % injection 3 mL  3 mL Intravenous Q12H [place], Rondell A, MD   3 mL at 05/16/21 1604   zinc sulfate capsule 220 mg  220 mg Oral Daily Madelyn Flavors A, MD   220 mg at 05/16/21 1605   Current Outpatient Medications  Medication Sig Dispense Refill   acetaminophen (TYLENOL) 500 MG tablet Take 500 mg by mouth every 6 (six) hours as needed for mild pain or headache.     apixaban (ELIQUIS) 5 MG TABS tablet Take 1 tablet (5 mg total) by mouth 2 (two) times daily. Start after completing the Eliquis Starter Pack 60 tablet 2    hydrochlorothiazide (HYDRODIURIL) 25 MG tablet Take 1 tablet (25 mg total) by mouth daily. 30 tablet 0   APIXABAN (ELIQUIS) VTE STARTER PACK (10MG  AND 5MG ) Take as directed on package: start with two-5mg  tablets twice daily for 7 days. On day 8 (May 21), switch to one-5mg  tablet twice daily. 1 each 0    REVIEW OF SYSTEMS:   Eyes: Denies blurriness of vision, double vision or watery eyes Ears, nose, mouth, throat, and face: Denies  mucositis or sore throat Respiratory: Denies cough, dyspnea or wheezes Cardiovascular: Denies palpitation, chest discomfort or lower extremity swelling Gastrointestinal:  Denies nausea, heartburn or change in bowel habits Skin: Denies abnormal skin rashes Lymphatics: Denies new lymphadenopathy or easy bruising Neurological:Denies numbness, tingling or new weaknesses Behavioral/Psych: Mood is stable, no new changes  All other systems were reviewed with the patient and are negative.  PHYSICAL EXAMINATION: ECOG PERFORMANCE STATUS: 1 - Symptomatic but completely ambulatory  Vitals:   05/16/21 1630 05/16/21 1645  BP: (!) 141/89 (!) 143/86  Pulse: 87 74  Resp:    Temp:    SpO2: 97% 98%   Filed Weights   05/16/21 0657  Weight: (!) 385 lb 12.9 oz (175 kg)    GENERAL:alert, no distress and comfortable NEURO: no focal motor/sensory deficits  LABORATORY DATA:  I have reviewed the data as listed Lab Results  Component Value Date   WBC 5.9 05/16/2021   HGB 13.3 05/16/2021   HCT 41.7 05/16/2021   MCV 90.7 05/16/2021   PLT 194 05/16/2021   Recent Labs    05/16/21 0702  NA 134*  K 3.6  CL 99  CO2 24  GLUCOSE 119*  BUN 17  CREATININE 1.39*  CALCIUM 9.2  GFRNONAA >60  PROT 7.7  ALBUMIN 4.1  AST 29  ALT 40  ALKPHOS 50  BILITOT 1.1    RADIOGRAPHIC STUDIES: I have personally reviewed the radiological images as listed and agreed with the findings in the report. CT Angio Chest PE W and/or Wo Contrast  Result Date: 05/16/2021 CLINICAL DATA:  PE  suspected, low/intermediate prob, positive D-dimer EXAM: CT ANGIOGRAPHY CHEST WITH CONTRAST TECHNIQUE: Multidetector CT imaging of the chest was performed using the standard protocol during bolus administration of intravenous contrast. Multiplanar CT image reconstructions and MIPs were obtained to evaluate the vascular anatomy. CONTRAST:  OMNIPAQUE IOHEXOL 350 MG/ML SOLN COMPARISON:  Multiple priors, most recent May 12 21. FINDINGS: Cardiovascular: Limited evaluation due to motion, beam hardening artifact and contrast bolus timing with possible thrombus in distal segmental right lower lobe pulmonary artery branch (for example series 5, image 97). No central pulmonary embolism. Unremarkable aorta. Mild cardiomegaly. Mediastinum/Nodes: No enlarged mediastinal, hilar, or axillary lymph nodes. Thyroid gland, trachea, and esophagus demonstrate no significant findings. Lungs/Pleura: Mild dependent ground-glass opacities, likely atelectasis. No confluent consolidation. No pneumothorax or pleural effusions. Upper Abdomen: No acute abnormality. Musculoskeletal: No chest wall abnormality. No acute or significant osseous findings. Review of the MIP images confirms the above findings. IMPRESSION: 1. Limited evaluation due to motion, beam hardening artifact and contrast bolus timing with possible thrombus in distal segmental right lower lobe pulmonary artery branch. No central pulmonary embolus. 2. Mild dependent atelectasis.  Otherwise, lungs are clear. Electronically Signed   By: Feliberto Harts MD   On: 05/16/2021 13:09   DG Chest Portable 1 View  Result Date: 05/16/2021 CLINICAL DATA:  dyspnea, hx saddle pe EXAM: PORTABLE CHEST 1 VIEW COMPARISON:  Radiograph 02/21/2020, chest CT 02/21/2020 FINDINGS: Cardiac silhouette is at the upper limits of normal, unchanged. There is no new focal airspace disease. There is no large pleural effusion or visible pneumothorax. There is no acute osseous abnormality. IMPRESSION: No  focal airspace disease. Electronically Signed   By: Caprice Renshaw   On: 05/16/2021 09:38

## 2021-05-16 NOTE — H&P (Signed)
History and Physical    Riley Wells YIR:[id number] DOB: [birth year] DOA: 05/16/2021  Referring MD/NP/PA: Thereasa Solo, DO PCP: Montez Hageman, DO  Patient coming from: Home  Chief Complaint: Fever, headache, and fatigue  I have personally briefly reviewed patient's old medical records in [facility]   HPI: Riley Wells is a 35 y.o. male with medical history significant of DVT/PE on Eliquis, hypertension, morbid obesity, and OSA on CPAP who presents with 1 day history of fever, headache, and fatigue.  Patient reports having generalized headache that is not the worst headache of his life.  Associated symptoms include body aches, shortness of breath, and loose stools today.  He has not had any significant cough.  Denied having any chest pain, palpitations, nausea, vomiting, dysuria, leg swelling, calf pain, changes in vision, focal weakness or recent travel.  Patient is not vaccinated against COVID-19.  He had felt like he possibly had the flu, and therefore came to the hospital for further evaluation today.  Patient reports that he has been compliant with his Eliquis at least in last 3 weeks.  His wife reports that at the very beginning of July he had ran out of his Eliquis for possibly 1-2 weeks.  He previously had left leg DVT back in 2018 while traveling to [place] and had been treated with Xarelto for 6 months as symptoms were thought to be provoked.  Family history significant for grandfather who had history of blood clots.  Thereafter, patient had been hospitalized in 02/2020 after developing acute shortness of breath found to have a saddle PE with right heart strain for which he underwent thrombectomy with interventional radiology and had been placed on Eliquis.    ED Course: Upon admission to the emergency department patient was seen to be febrile up to 100.4 F, with falls 76-102, respirations 18-24,  blood pressures maintained, and all other vital signs stable.  Labs  significant for sodium 134, BUN 17, creatinine 1.39,High-sensitivity troponin negative x2, and D-dimer 0.78.  Urinalysis did not show any significant signs of infection, but had elevated specific gravity of 1.031.  Patient had been started on heparin for concern of failure of Eliquis.  Review of Systems  Constitutional:  Positive for chills, fever and malaise/fatigue.  HENT:  Negative for nosebleeds.   Eyes:  Negative for photophobia and pain.  Respiratory:  Positive for shortness of breath. Negative for cough.   Cardiovascular:  Negative for chest pain and leg swelling.  Gastrointestinal:  Negative for abdominal pain, blood in stool, nausea and vomiting.  Genitourinary:  Negative for dysuria and flank pain.  Musculoskeletal:  Positive for myalgias. Negative for falls.  Neurological:  Positive for headaches. Negative for focal weakness and loss of consciousness.  Psychiatric/Behavioral:  Negative for substance abuse.    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 His smoking use included cigarettes. He started smoking about 21 years ago. He smoked an average of 1 pack per day. He has never used smokeless  tobacco. He reports that he does not drink alcohol and does not use drugs.  No Known Allergies  No family history on file.  Prior to Admission medications   Medication Sig Start Date End Date Taking? Authorizing Provider  acetaminophen (TYLENOL) 500 MG tablet Take 500 mg by mouth every 6 (six) hours as needed for mild pain or headache.   Yes  [provider]  apixaban (ELIQUIS) 5 MG TABS tablet Take 1 tablet (5 mg total) by mouth 2 (two) times daily. Start after completing the Eliquis Starter Pack 02/23/20  Yes Narda BondsNettey, Ralph A, MD  hydrochlorothiazide (HYDRODIURIL) 25 MG tablet Take 1 tablet (25 mg total) by mouth daily. 02/25/20  Yes Narda BondsNettey, Ralph A, MD  APIXABAN Everlene Balls(ELIQUIS) VTE STARTER PACK (10MG  AND 5MG ) Take as directed on package: start with two-5mg  tablets twice daily for 7 days. On day 8 (May 21), switch to one-5mg  tablet twice daily. 02/23/20   Narda BondsNettey, Ralph A, MD    Physical Exam:  Constitutional: NAD, calm, comfortable Vitals:   05/16/21 1230 05/16/21 1245 05/16/21 1302 05/16/21 1330  BP: 125/80 129/74 119/73 128/77  Pulse: 87 86 86 76  Resp: 20  20   Temp:      TempSrc:      SpO2: 99% 99% 99% 98%  Weight:      Height:       Eyes: PERRL, lids and conjunctivae normal ENMT: Mucous membranes are moist. Posterior pharynx clear of any exudate or lesions.Normal dentition.  Neck: normal, supple, no masses, no thyromegaly Respiratory: clear to auscultation bilaterally, no wheezing, no crackles. Normal respiratory effort. No accessory muscle use.  Cardiovascular: Regular rate and rhythm, no murmurs / rubs / gallops. No extremity edema. 2+ pedal pulses. No carotid bruits.  Abdomen: no tenderness, no masses palpated. No hepatosplenomegaly. Bowel sounds positive.  Musculoskeletal: no clubbing / cyanosis. No joint deformity upper and lower extremities. Good ROM, no contractures. Normal muscle tone.  Skin: no rashes, lesions, ulcers. No induration Neurologic: CN 2-12 grossly intact. Sensation intact, DTR normal. Strength 5/5 in all 4.  Psychiatric: Normal judgment and insight. Alert and oriented x 3. Normal mood.     Labs on Admission: I have personally reviewed following labs and imaging studies  CBC: Recent Labs  Lab 05/16/21 0702  WBC 5.9  NEUTROABS 4.0  HGB 13.3  HCT 41.7  MCV 90.7  PLT 194   Basic  Metabolic Panel: Recent Labs  Lab 05/16/21 0702  NA 134*  K 3.6  CL 99  CO2 24  GLUCOSE 119*  BUN 17  CREATININE 1.39*  CALCIUM 9.2   GFR: Estimated Creatinine Clearance: 126.4 mL/min (A) (by C-G formula based on SCr of 1.39 mg/dL (H)). Liver Function Tests: Recent Labs  Lab 05/16/21 0702  AST 29  ALT 40  ALKPHOS 50  BILITOT 1.1  PROT 7.7  ALBUMIN 4.1   No results for input(s): LIPASE, AMYLASE in the last 168 hours. No results for input(s): AMMONIA in the last 168 hours. Coagulation Profile: No results for input(s): INR, PROTIME in the last 168 hours. Cardiac Enzymes: No results for input(s): CKTOTAL, CKMB, CKMBINDEX, TROPONINI in the last 168 hours. BNP (last 3 results) No results for input(s): PROBNP in the last 8760 hours. HbA1C: No results for input(s): HGBA1C in the last 72 hours. CBG: No results for input(s): GLUCAP in the last 168 hours. Lipid Profile: No results for input(s): CHOL, HDL, LDLCALC, TRIG, CHOLHDL, LDLDIRECT in the last 72 hours. Thyroid Function Tests: No  results for input(s): TSH, T4TOTAL, FREET4, T3FREE, THYROIDAB in the last 72 hours. Anemia Panel: No results for input(s): VITAMINB12, FOLATE, FERRITIN, TIBC, IRON, RETICCTPCT in the last 72 hours. Urine analysis:    Component Value Date/Time   COLORURINE YELLOW 05/16/2021 0855   APPEARANCEUR HAZY (A) 05/16/2021 0855   LABSPEC 1.031 (H) 05/16/2021 0855   PHURINE 5.0 05/16/2021 0855   GLUCOSEU NEGATIVE 05/16/2021 0855   HGBUR MODERATE (A) 05/16/2021 0855   BILIRUBINUR NEGATIVE 05/16/2021 0855   KETONESUR NEGATIVE 05/16/2021 0855   PROTEINUR 30 (A) 05/16/2021 0855   NITRITE NEGATIVE 05/16/2021 0855   LEUKOCYTESUR NEGATIVE 05/16/2021 0855   Sepsis Labs: Recent Results (from the past 240 hour(s))  Resp Panel by RT-PCR (Flu A&B, Covid) Nasopharyngeal Swab     Status: Abnormal   Collection Time: 05/16/21  9:36 AM   Specimen: Nasopharyngeal Swab; Nasopharyngeal(NP) swabs in vial transport  medium  Result Value Ref Range Status   SARS Coronavirus 2 by RT PCR POSITIVE (A) NEGATIVE Final    Comment: RESULT CALLED TO, READ BACK BY AND VERIFIED WITH: RN Lowella Bandy COOK 05/16/21@11 :55 BY TW (NOTE) SARS-CoV-2 target nucleic acids are DETECTED.  The SARS-CoV-2 RNA is generally detectable in upper respiratory specimens during the acute phase of infection. Positive results are indicative of the presence of the identified virus, but do not rule out bacterial infection or co-infection with other pathogens not detected by the test. Clinical correlation with patient history and other diagnostic information is necessary to determine patient infection status. The expected result is Negative.  Fact Sheet for Patients: BloggerCourse.com  Fact Sheet for Healthcare Providers: SeriousBroker.it  This test is not yet approved or cleared by the [place] FDA and  has been authorized for detection and/or diagnosis of SARS-CoV-2 by FDA under an Emergency Use Authorization (EUA).  This EUA will remain in effect (meaning this test can b e used) for the duration of  the COVID-19 declaration under Section 564(b)(1) of the Act, 21 U.S.C. section 360bbb-3(b)(1), unless the authorization is terminated or revoked sooner.     Influenza A by PCR NEGATIVE NEGATIVE Final   Influenza B by PCR NEGATIVE NEGATIVE Final    Comment: (NOTE) The Xpert Xpress SARS-CoV-2/FLU/RSV plus assay is intended as an aid in the diagnosis of influenza from Nasopharyngeal swab specimens and should not be used as a sole basis for treatment. Nasal washings and aspirates are unacceptable for Xpert Xpress SARS-CoV-2/FLU/RSV testing.  Fact Sheet for Patients: BloggerCourse.com  Fact Sheet for Healthcare Providers: SeriousBroker.it  This test is not yet approved or cleared by the [place] FDA and has been authorized for  detection and/or diagnosis of SARS-CoV-2 by FDA under an Emergency Use Authorization (EUA). This EUA will remain in effect (meaning this test can be used) for the duration of the COVID-19 declaration under Section 564(b)(1) of the Act, 21 U.S.C. section 360bbb-3(b)(1), unless the authorization is terminated or revoked.  Performed at [facility], [address] [place], [place] [address]      Radiological Exams on Admission: CT Angio Chest PE W and/or Wo Contrast  Result Date: 05/16/2021 CLINICAL DATA:  PE suspected, low/intermediate prob, positive D-dimer EXAM: CT ANGIOGRAPHY CHEST WITH CONTRAST TECHNIQUE: Multidetector CT imaging of the chest was performed using the standard protocol during bolus administration of intravenous contrast. Multiplanar CT image reconstructions and MIPs were obtained to evaluate the vascular anatomy. CONTRAST:  OMNIPAQUE IOHEXOL 350 MG/ML SOLN COMPARISON:  Multiple priors, most recent May 12 21. FINDINGS:  Cardiovascular: Limited evaluation due to motion, beam hardening artifact and contrast bolus timing with possible thrombus in distal segmental right lower lobe pulmonary artery branch (for example series 5, image 97). No central pulmonary embolism. Unremarkable aorta. Mild cardiomegaly. Mediastinum/Nodes: No enlarged mediastinal, hilar, or axillary lymph nodes. Thyroid gland, trachea, and esophagus demonstrate no significant findings. Lungs/Pleura: Mild dependent ground-glass opacities, likely atelectasis. No confluent consolidation. No pneumothorax or pleural effusions. Upper Abdomen: No acute abnormality. Musculoskeletal: No chest wall abnormality. No acute or significant osseous findings. Review of the MIP images confirms the above findings. IMPRESSION: 1. Limited evaluation due to motion, beam hardening artifact and contrast bolus timing with possible thrombus in distal segmental right lower lobe pulmonary artery branch. No central pulmonary embolus. 2.  Mild dependent atelectasis.  Otherwise, lungs are clear. Electronically Signed   By: Feliberto Harts MD   On: 05/16/2021 13:09   DG Chest Portable 1 View  Result Date: 05/16/2021 CLINICAL DATA:  dyspnea, hx saddle pe EXAM: PORTABLE CHEST 1 VIEW COMPARISON:  Radiograph 02/21/2020, chest CT 02/21/2020 FINDINGS: Cardiac silhouette is at the upper limits of normal, unchanged. There is no new focal airspace disease. There is no large pleural effusion or visible pneumothorax. There is no acute osseous abnormality. IMPRESSION: No focal airspace disease. Electronically Signed   By: Caprice Renshaw   On: 05/16/2021 09:38    EKG: Independently reviewed.  Normal sinus rhythm 89 bpm  Assessment/Plan COVID-19 infection: Acute.  Patient presented with complaints of headache, fever, malaise, myalgias, and shortness of breath.  O2 saturations were otherwise maintained on room air.  Imaging did not note any concern for pneumonia.  Patient had been ordered bebtelovimab. -Admit to medical telemetry bed -COVID-19 order set utilized -On nasal cannula oxygen as needed for O2 saturations less than 92% -Albuterol inhaler every 6 hours shortness of breath -Antitussives as needed -Vitamin C and zinc -Acetaminophen as needed for fever -Patient would benefit from receiving COVID-19 vaccine at some point as natural immunity after having COVID is normally only for 90 days.  Concern for acute pulmonary embolus history of pulmonary embolus and DVT on chronic anticoagulation: D-dimer was noted to be elevated at 0.78 for which a CT angiogram have been obtained and gave concern although a suboptimal study for a possible right lower lobe pulmonary artery branch thrombus.  Patient had initially been ordered to be started on heparin drip for concern of failure of Eliquis.  Previous history of DVT back in 2018, and saddle PE in 2021 requiring thrombolysis with interventional radiology. -Discontinued heparin drip after talks with  oncology who felt symptoms less likely related to a failure.  Based off patient history symptoms may be related with noncompliance for which it was stressed the need to take medication as prescribed. -Continue Eliquis -Hematology oncology consulted for formal recommendations.  Acute kidney injury secondary to dehydration: Acute.  Patient presents with creatinine elevated up to 1.39 with BUN 17.  Urinalysis revealed patient's urine was very concentrated also to suggest he was dehydrated.  Plus, patient is on a diuretic and addition to extravascular losses with fever.  Patient was initially given 2 L of normal saline IV fluid. -Will avoid any additional aggressive hydration at this time in the setting of COVID-19 -Hold nephrotoxic agents such as patient's hydrochlorothiazide  Essential hypertension: Home medications include hydrochlorothiazide 25 mg daily. -Hydrochlorothiazide held due to AKI  Hyponatremia: Acute.  Sodium 134 on admission.  Likely related with diuretic use and fever. -Continue to monitor  Obstructive sleep  apnea -Continue CPAP at night  Morbid obesity: BMI 49.53 kg/m -Discussed with the patient need of weight loss with dietary and lifestyle modifications  DVT prophylaxis: Lovenox Code Status: Full Family Communication: Wife updated at bedside Disposition Plan: Home Consults called: Hematology oncology Admission status: Observation  Clydie Braun MD [facility]   If 7PM-7AM, please contact night-coverage   05/16/2021, 1:56 PM

## 2021-05-16 NOTE — Discharge Instructions (Addendum)
Please obtain COVID19 vaccination once you are feeling better

## 2021-05-16 NOTE — ED Notes (Signed)
Admitting at BS

## 2021-05-16 NOTE — Progress Notes (Deleted)
ANTICOAGULATION CONSULT NOTE - Initial Consult  Pharmacy Consult for heparin Indication: pulmonary embolus  No Known Allergies  Patient Measurements: Height: 6\' 2"  (188 cm) Weight: (!) 175 kg (385 lb 12.9 oz) IBW/kg (Calculated) : 82.2 Heparin Dosing Weight: 124.4 kg  Vital Signs: Temp: 100.4 F (38 C) (08/05 0945) Temp Source: Oral (08/05 0945) BP: 125/80 (08/05 1400) Pulse Rate: 83 (08/05 1400)  Labs: Recent Labs    05/16/21 0702 05/16/21 0936 05/16/21 1100  HGB 13.3  --   --   HCT 41.7  --   --   PLT 194  --   --   CREATININE 1.39*  --   --   TROPONINIHS  --  5 5    Estimated Creatinine Clearance: 126.4 mL/min (A) (by C-G formula based on SCr of 1.39 mg/dL (H)).   Medical History: Past Medical History:  Diagnosis Date   DVT (deep vein thrombosis) in pregnancy    PE (pulmonary thromboembolism) (HCC)    Assessment: 35 yo M with hx of PE/DVT, on eliquis PTA, last dose Thurs 8/4 AM. Presents with tachycardia, no resp distress. Some dyspnea. Found to have COVID-19, and CTA chest obtained with possible segmental PE. No right heart strain noted on read or any SOB. On RA.   Goal of Therapy:  Heparin level 0.3-0.7 units/ml Monitor platelets by anticoagulation protocol: Yes   Plan:  -Given that patient is on PTA Eliquis with dose >24h ago, will give a small bolus (1/3 dose) and given patients size, will Give 3000 units bolus x 1 Start heparin infusion at 2100 units/hr Check anti-Xa level in 6 hours and daily while on heparin Continue to monitor H&H and platelets -F/u transition to oral AC - unsure will go back on DOAC, given failure on it x2 (previously for unprovoked saddle PE).  20, PharmD, [facility] Medicine Clinical Pharmacist ED RPh Phone: [phone] Main RX: [phone]

## 2021-05-16 NOTE — Discharge Summary (Signed)
Physician Discharge Summary  Riley Wells CZY:[id number] DOB: [birth year] DOA: 05/16/2021  PCP: Montez Hageman, DO  Admit date: 05/16/2021 Discharge date: 05/16/2021  Recommendations for Outpatient Follow-up:  Patient should follow-up with PCP in 2weeks to recheck kidney function   Discharge Diagnoses:  COVID -19 virus infection History of DVT/PE on chronic anticoagulation Acute kidney injury Essential hypertension Hyponatremia Obstructive sleep apnea on CPAP Morbid obesity  Discharge Condition: Stable Disposition: Home  Diet recommendation: Heart healthy  Filed Weights   05/16/21 0657  Weight: (!) 175 kg    History of present illness:   Riley Wells is a 35 y.o. male with medical history significant of DVT/PE on Eliquis, hypertension, morbid obesity, and OSA on CPAP who presents with 1 day history of fever, headache, and fatigue.  Patient reports having generalized headache that is not the worst headache of his life.  Associated symptoms include body aches, shortness of breath, and loose stools today.  He has not had any significant cough.  Denied having any chest pain, palpitations, nausea, vomiting, dysuria, leg swelling, calf pain, changes in vision, focal weakness or recent travel.  Patient is not vaccinated against COVID-19.  He had felt like he possibly had the flu, and therefore came to the hospital for further evaluation today.  Patient reports that he has been compliant with his Eliquis at least in last 3 weeks.  His wife reports that at the very beginning of July he had ran out of his Eliquis for possibly 1-2 weeks.   He previously had left leg DVT back in 2018 while traveling to [place] and had been treated with Xarelto for 6 months as symptoms were thought to be provoked.  Family history significant for grandfather who had history of blood clots.  Thereafter, patient had been hospitalized in 02/2020 after developing acute shortness of breath found to have a saddle  PE with right heart strain for which he underwent thrombectomy with interventional radiology and had been placed on Eliquis.       ED Course: Upon admission to the emergency department patient was seen to be febrile up to 100.4 F, with falls 76-102, respirations 18-24,  blood pressures maintained, and all other vital signs stable.  Labs significant for sodium 134, BUN 17, creatinine 1.39,High-sensitivity troponin negative x2, and D-dimer 0.78.  Urinalysis did not show any significant signs of infection, but had elevated specific gravity of 1.031.  Patient had been started on heparin for concern of failure of Eliquis.   Hospital Course:   COVID-19 infection: Acute.  Patient presented with complaints of headache, fever, malaise, myalgias, and shortness of breath over the last day.  O2 saturations were maintained on room air.  COVID-19 screening was positive.  Imaging did not note any concern for pneumonia.  He had received an infusion of bebtelovimab.  Patient was given albuterol inhaler and shown how to use it prior to being discharged.  Recommended him to continue symptomatic treatment with albuterol inhaler as needed for shortness of breath/wheezing, Tylenol as needed for fever, vitamin C, and zinc. He was advised of the benefit to receiving COVID-19 vaccine at some point as natural immunity after having COVID is normally only for 90 days.  Recommended being out of work for at least 5 days with improving symptoms, and to wear mask for at least a total of 10 days.  Advised patient to monitor his O2 saturations to seek medical attention if O2 saturation dropping below 90% along with other for which he  should return to the hospital or seek medical attention.   History of pulmonary embolus/DVT on chronic anticoagulation: On he did not report any complaints of chest pain.  D-dimer was noted to be elevated at 0.78 for which a CT angiogram head been obtained, and gave concern(although a suboptimal study) for a  possible right lower lobe pulmonary artery branch thrombus.  Patient had initially been ordered to be started on heparin drip for concern of failure of Eliquis.  He had a history of a left leg DVT back in 2018 for which he was treated with Xarelto for 6 months as it was thought to be provoked and was discontinued thereafter.  However, patient had a saddle PE in 2021 requiring thrombolysis with interventional radiology and thereafter had been prescribed Eliquis.  Patient did admitted to intermittently missing doses, but over the last 3 weeks have been compliant taking as prescribed.  Case was discussed with oncology and initial orders for heparin drip were discontinued as symptoms felt less likely related to failure of Eliquis.  Oncology have been formally consulted and felt that if there was a pulmonary embolus present that it was not likely new and he could be continued on Eliquis.  Stressed the need to take medication as prescribed.  Acute kidney injury secondary to dehydration: Acute.  Patient presents with creatinine elevated up to 1.39 with BUN 17.  Urinalysis revealed patient's urine was very concentrated also to suggest he was dehydrated.  Plus, patient is on a diuretic and secondary loss of with fever.  Patient was initially given 2 L of normal saline IV fluid.  Avoided any additional IV fluid hydration and recommended patient to hold his hydrochlorothiazide at least 5 days until after fevers resolved.  He can follow-up with his primary care provider for repeat check of kidney function.  Essential hypertension: Blood pressure remained stable during his hospital stay.  Home medications include hydrochlorothiazide 25 mg daily.  Recommended holding hydrochlorothiazide at least 5 days until after fever have resolved.   Hyponatremia: Acute.  Sodium 134 on admission.  Likely related with diuretic use and  secondary losses -Continue to monitor  Obstructive sleep apneaContinue CPAP at night   Morbid  obesity: BMI 49.53 kg/m -Discussed with the patient need of weight loss with dietary and lifestyle modifications     Discharge Instructions  Discharge Instructions     MyChart COVID-19 home monitoring program   Complete by: May 16, 2021    Is the patient willing to use the MyChart Mobile App for home monitoring?: Yes   Temperature monitoring   Complete by: May 16, 2021    After how many days would you like to receive a notification of this patient's flowsheet entries?: 1   Diet - low sodium heart healthy   Complete by: As directed    Discharge instructions   Complete by: As directed    Your symptoms of headache, fever, fatigue, and shortness of breath are suspected to be secondary to acute COVID-19 infection.  You received monoclonal antibody infusion to help prevent need for admission into the hospital.  An albuterol inhaler should be given to you prior to leaving the hospital.  It is to be used as needed for shortness of breath or wheezing.  Otherwise continue symptomatic treatment with Tylenol as needed for fever/headache, over-the-counter vitamin C and zinc, and medications like Mucinex as needed for cough.  If you develop worsening shortness of breath or noticed that you are oxygenation drops below 92% with a  pulse oximetry meter (which can be obtained at any local pharmacy) please be advised to return back to the hospital.  Patient should be out of work for at least 5 days with improving symptoms, but should wear a mask while at work for least 10 days. Hold hydrochlorothiazide for at least 5 days until after fever resolves and then can resume regular dose. Advised patient to take his Eliquis as prescribed and to not miss any doses.   Increase activity slowly   Complete by: As directed       Allergies as of 05/16/2021   No Known Allergies      Medication List     STOP taking these medications    hydrochlorothiazide 25 MG tablet Commonly known as: HYDRODIURIL       TAKE  these medications    acetaminophen 500 MG tablet Commonly known as: TYLENOL Take 1 tablet (500 mg total) by mouth every 6 (six) hours as needed for mild pain, headache or fever. What changed: reasons to take this   albuterol 108 (90 Base) MCG/ACT inhaler Commonly known as: VENTOLIN HFA Inhale 2 puffs into the lungs once as needed for wheezing or shortness of breath (for Grade 3 or 4 hypersensitivity reaction).   apixaban 5 MG Tabs tablet Commonly known as: Eliquis Take 1 tablet (5 mg total) by mouth 2 (two) times daily. Start after completing the Eliquis Starter Pack What changed: Another medication with the same name was removed. Continue taking this medication, and follow the directions you see here.   ascorbic acid 500 MG tablet Commonly known as: VITAMIN C Take 1 tablet (500 mg total) by mouth daily.   zinc sulfate 220 (50 Zn) MG capsule Take 1 capsule (220 mg total) by mouth daily.       No Known Allergies  The results of significant diagnostics from this hospitalization (including imaging, microbiology, ancillary and laboratory) are listed below for reference.    Significant Diagnostic Studies: CT Angio Chest PE W and/or Wo Contrast  Result Date: 05/16/2021 CLINICAL DATA:  PE suspected, low/intermediate prob, positive D-dimer EXAM: CT ANGIOGRAPHY CHEST WITH CONTRAST TECHNIQUE: Multidetector CT imaging of the chest was performed using the standard protocol during bolus administration of intravenous contrast. Multiplanar CT image reconstructions and MIPs were obtained to evaluate the vascular anatomy. CONTRAST:  OMNIPAQUE IOHEXOL 350 MG/ML SOLN COMPARISON:  Multiple priors, most recent May 12 21. FINDINGS: Cardiovascular: Limited evaluation due to motion, beam hardening artifact and contrast bolus timing with possible thrombus in distal segmental right lower lobe pulmonary artery branch (for example series 5, image 97). No central pulmonary embolism. Unremarkable aorta. Mild  cardiomegaly. Mediastinum/Nodes: No enlarged mediastinal, hilar, or axillary lymph nodes. Thyroid gland, trachea, and esophagus demonstrate no significant findings. Lungs/Pleura: Mild dependent ground-glass opacities, likely atelectasis. No confluent consolidation. No pneumothorax or pleural effusions. Upper Abdomen: No acute abnormality. Musculoskeletal: No chest wall abnormality. No acute or significant osseous findings. Review of the MIP images confirms the above findings. IMPRESSION: 1. Limited evaluation due to motion, beam hardening artifact and contrast bolus timing with possible thrombus in distal segmental right lower lobe pulmonary artery branch. No central pulmonary embolus. 2. Mild dependent atelectasis.  Otherwise, lungs are clear. Electronically Signed   By: Feliberto Harts MD   On: 05/16/2021 13:09   DG Chest Portable 1 View  Result Date: 05/16/2021 CLINICAL DATA:  dyspnea, hx saddle pe EXAM: PORTABLE CHEST 1 VIEW COMPARISON:  Radiograph 02/21/2020, chest CT 02/21/2020 FINDINGS: Cardiac silhouette is at  the upper limits of normal, unchanged. There is no new focal airspace disease. There is no large pleural effusion or visible pneumothorax. There is no acute osseous abnormality. IMPRESSION: No focal airspace disease. Electronically Signed   By: Caprice Renshaw   On: 05/16/2021 09:38    Microbiology: Recent Results (from the past 240 hour(s))  Resp Panel by RT-PCR (Flu A&B, Covid) Nasopharyngeal Swab     Status: Abnormal   Collection Time: 05/16/21  9:36 AM   Specimen: Nasopharyngeal Swab; Nasopharyngeal(NP) swabs in vial transport medium  Result Value Ref Range Status   SARS Coronavirus 2 by RT PCR POSITIVE (A) NEGATIVE Final    Comment: RESULT CALLED TO, READ BACK BY AND VERIFIED WITH: RN Lowella Bandy COOK 05/16/21@11 :55 BY TW (NOTE) SARS-CoV-2 target nucleic acids are DETECTED.  The SARS-CoV-2 RNA is generally detectable in upper respiratory specimens during the acute phase of infection.  Positive results are indicative of the presence of the identified virus, but do not rule out bacterial infection or co-infection with other pathogens not detected by the test. Clinical correlation with patient history and other diagnostic information is necessary to determine patient infection status. The expected result is Negative.  Fact Sheet for Patients: BloggerCourse.com  Fact Sheet for Healthcare Providers: SeriousBroker.it  This test is not yet approved or cleared by the [place] FDA and  has been authorized for detection and/or diagnosis of SARS-CoV-2 by FDA under an Emergency Use Authorization (EUA).  This EUA will remain in effect (meaning this test can b e used) for the duration of  the COVID-19 declaration under Section 564(b)(1) of the Act, 21 U.S.C. section 360bbb-3(b)(1), unless the authorization is terminated or revoked sooner.     Influenza A by PCR NEGATIVE NEGATIVE Final   Influenza B by PCR NEGATIVE NEGATIVE Final    Comment: (NOTE) The Xpert Xpress SARS-CoV-2/FLU/RSV plus assay is intended as an aid in the diagnosis of influenza from Nasopharyngeal swab specimens and should not be used as a sole basis for treatment. Nasal washings and aspirates are unacceptable for Xpert Xpress SARS-CoV-2/FLU/RSV testing.  Fact Sheet for Patients: BloggerCourse.com  Fact Sheet for Healthcare Providers: SeriousBroker.it  This test is not yet approved or cleared by the [place] FDA and has been authorized for detection and/or diagnosis of SARS-CoV-2 by FDA under an Emergency Use Authorization (EUA). This EUA will remain in effect (meaning this test can be used) for the duration of the COVID-19 declaration under Section 564(b)(1) of the Act, 21 U.S.C. section 360bbb-3(b)(1), unless the authorization is terminated or revoked.  Performed at [facility],  [address] [place], [place] [address]      Labs: Basic Metabolic Panel: Recent Labs  Lab 05/16/21 0702  NA 134*  K 3.6  CL 99  CO2 24  GLUCOSE 119*  BUN 17  CREATININE 1.39*  CALCIUM 9.2   Liver Function Tests: Recent Labs  Lab 05/16/21 0702  AST 29  ALT 40  ALKPHOS 50  BILITOT 1.1  PROT 7.7  ALBUMIN 4.1   No results for input(s): LIPASE, AMYLASE in the last 168 hours. No results for input(s): AMMONIA in the last 168 hours. CBC: Recent Labs  Lab 05/16/21 0702  WBC 5.9  NEUTROABS 4.0  HGB 13.3  HCT 41.7  MCV 90.7  PLT 194   Cardiac Enzymes: No results for input(s): CKTOTAL, CKMB, CKMBINDEX, TROPONINI in the last 168 hours. BNP: BNP (last 3 results) Recent Labs    05/16/21 0936  BNP 43.5  ProBNP (last 3 results) No results for input(s): PROBNP in the last 8760 hours.  CBG: No results for input(s): GLUCAP in the last 168 hours.  Principal Problem:   COVID-19 virus infection Active Problems:   History of pulmonary embolus (PE)   History of DVT (deep vein thrombosis)   Severe obstructive sleep apnea   Obesity, Class III, BMI 40-49.9 (morbid obesity) (HCC)   Time coordinating discharge: >30  Signed:  Brendia Sacksaniel Goodrich, MD [facility] 05/16/2021, 5:24 PM

## 2021-05-16 NOTE — ED Notes (Signed)
Back from b/r, steady gait, given ibuprofen, VSS.

## 2021-05-16 NOTE — ED Provider Notes (Signed)
[facility] Provider Note   CSN: [id number] Arrival date & time: 05/16/21  [id number]     History Chief Complaint  Patient presents with   Fever/fatigue/headache    Riley Wells is a 35 y.o. male.  This is a 35 yo male with history as below significant for saddle PE, DVT, OSA on CPAP, anticoagulated on Eliquis presents to the ED for multiple complaints.  Patient reports yesterday afternoon while at work he began to feel headache, short of breath, fatigue, increased urinary frequency. Reports symptoms gradually worsened throughout the evening. He woke up this morning with ongoing symptoms. Headache was not described as worst of his life, was not sudden in onset, and was not associated with neurological deficits. Headache described as dull/aching/bilateral. He has ongoing arthralgias. Increased urinary frequency without dysuria or abdominal pain. No change to bowel function, no blood per rectum or black stool. No n/v. He is tolerating PO appropriately. He feels his dyspnea has improved since the onset yesterday. No chest pain or palpitations. Reports subjective fevers/chills this morning that have been ongoing. He is not COVID19 vaccinated.   The history is provided by the patient. No language interpreter was used.      Past Medical History:  Diagnosis Date   DVT (deep vein thrombosis) in pregnancy    PE (pulmonary thromboembolism) (HCC)     Patient Active Problem List   Diagnosis Date Noted   VTE (venous thromboembolism) 03/07/2020   Former smoker 03/07/2020   Severe obstructive sleep apnea 03/07/2020   Pulmonary HTN (HCC) 03/07/2020   Obesity 02/23/2020   History of DVT (deep vein thrombosis) 02/23/2020   History of pulmonary embolus (PE) 02/21/2020    Past Surgical History:  Procedure Laterality Date   IR ANGIOGRAM PULMONARY BILATERAL SELECTIVE  02/21/2020   IR ANGIOGRAM SELECTIVE EACH ADDITIONAL VESSEL  02/21/2020   IR ANGIOGRAM  SELECTIVE EACH ADDITIONAL VESSEL  02/21/2020   IR THROMBECT VENO MECH MOD SED  02/21/2020   IR US GUIDE VASC ACCESS RIGHT  02/21/2020       History reviewed. No pertinent family history.  Social History   Tobacco Use   Smoking status: Former    Packs/day: 1.00    Types: Cigarettes    Start date: 2001    Quit date: 05/21/2020    Years since quitting: 0.9   Smokeless tobacco: Never  Vaping Use   Vaping Use: Former   Quit date: 01/29/2020  Substance Use Topics   Alcohol use: Never   Drug use: Never    Home Medications Prior to Admission medications   Medication Sig Start Date End Date Taking? Authorizing Provider  acetaminophen (TYLENOL) 500 MG tablet Take 500 mg by mouth every 6 (six) hours as needed for mild pain or headache.   Yes [provider]  apixaban (ELIQUIS) 5 MG TABS tablet Take 1 tablet (5 mg total) by mouth 2 (two) times daily. Start after completing the Eliquis Starter Pack 02/23/20  Yes Narda BondsNettey, Ralph A, MD  hydrochlorothiazide (HYDRODIURIL) 25 MG tablet Take 1 tablet (25 mg total) by mouth daily. 02/25/20  Yes Narda BondsNettey, Ralph A, MD  APIXABAN Everlene Balls(ELIQUIS) VTE STARTER PACK (10MG  AND 5MG ) Take as directed on package: start with two-5mg  tablets twice daily for 7 days. On day 8 (May 21), switch to one-5mg  tablet twice daily. 02/23/20   Narda BondsNettey, Ralph A, MD    Allergies    Patient has no known allergies.  Review of Systems   Review of  Systems  Constitutional:  Positive for chills and fever.  HENT:  Negative for facial swelling and trouble swallowing.   Eyes:  Negative for photophobia and visual disturbance.  Respiratory:  Positive for shortness of breath. Negative for cough and chest tightness.   Cardiovascular:  Negative for chest pain and palpitations.  Gastrointestinal:  Negative for abdominal pain, nausea and vomiting.  Endocrine: Negative for polydipsia and polyuria.  Genitourinary:  Positive for frequency. Negative for difficulty urinating, dysuria, hematuria  and testicular pain.  Musculoskeletal:  Positive for arthralgias and back pain. Negative for gait problem and joint swelling.  Skin:  Negative for pallor and rash.  Neurological:  Positive for headaches. Negative for syncope.  Psychiatric/Behavioral:  Negative for agitation and confusion.    Physical Exam Updated Vital Signs BP 128/77   Pulse 76   Temp (!) 100.4 F (38 C) (Oral)   Resp 20   Ht 6\' 2"  (1.88 m)   Wt (!) 175 kg   SpO2 98%   BMI 49.53 kg/m   Physical Exam Vitals and nursing note reviewed.  Constitutional:      General: He is not in acute distress.    Appearance: He is well-developed. He is obese. He is not diaphoretic.  HENT:     Head: Normocephalic and atraumatic.     Right Ear: External ear normal.     Left Ear: External ear normal.     Mouth/Throat:     Mouth: Mucous membranes are moist.  Eyes:     General: No scleral icterus.    Extraocular Movements: Extraocular movements intact.     Pupils: Pupils are equal, round, and reactive to light.  Neck:     Comments: No meningismus, no midline process tenderness to palpation, no crepitus or stepoff. Cardiovascular:     Rate and Rhythm: Regular rhythm. Tachycardia present.     Pulses: Normal pulses.     Heart sounds: Normal heart sounds.  Pulmonary:     Effort: Pulmonary effort is normal. No respiratory distress.     Breath sounds: Normal breath sounds.  Abdominal:     General: Abdomen is flat.     Palpations: Abdomen is soft.     Tenderness: There is no abdominal tenderness.  Musculoskeletal:        General: Normal range of motion.     Cervical back: Normal range of motion.     Right lower leg: Edema (trace) present.     Left lower leg: Edema (trace) present.  Skin:    General: Skin is warm and dry.     Capillary Refill: Capillary refill takes less than 2 seconds.  Neurological:     Mental Status: He is alert and oriented to person, place, and time.     GCS: GCS eye subscore is 4. GCS verbal subscore  is 5. GCS motor subscore is 6.     Cranial Nerves: Cranial nerves are intact.     Sensory: Sensation is intact.     Motor: Motor function is intact.     Coordination: Coordination is intact.     Gait: Gait is intact.  Psychiatric:        Mood and Affect: Mood normal.        Behavior: Behavior normal.    ED Results / Procedures / Treatments   Labs (all labs ordered are listed, but only abnormal results are displayed) Labs Reviewed  RESP PANEL BY RT-PCR (FLU A&B, COVID) ARPGX2 - Abnormal; Notable for the following components:  Result Value   SARS Coronavirus 2 by RT PCR POSITIVE (*)    All other components within normal limits  COMPREHENSIVE METABOLIC PANEL - Abnormal; Notable for the following components:   Sodium 134 (*)    Glucose, Bld 119 (*)    Creatinine, Ser 1.39 (*)    All other components within normal limits  URINALYSIS, ROUTINE W REFLEX MICROSCOPIC - Abnormal; Notable for the following components:   APPearance HAZY (*)    Specific Gravity, Urine 1.031 (*)    Hgb urine dipstick MODERATE (*)    Protein, ur 30 (*)    All other components within normal limits  D-DIMER, QUANTITATIVE - Abnormal; Notable for the following components:   D-Dimer, Quant 0.78 (*)    All other components within normal limits  CBC WITH DIFFERENTIAL/PLATELET  BRAIN NATRIURETIC PEPTIDE  APTT  PROTIME-INR  HEPARIN LEVEL (UNFRACTIONATED)  TROPONIN I (HIGH SENSITIVITY)  TROPONIN I (HIGH SENSITIVITY)    EKG EKG Interpretation  Date/Time:  Friday May 16 2021 09:29:48 EDT Ventricular Rate:  89 PR Interval:  168 QRS Duration: 96 QT Interval:  360 QTC Calculation: 438 R Axis:   56 Text Interpretation: Normal sinus rhythm Normal ECG Confirmed by Tanda Rockers ([id number]) on 05/16/2021 9:36:49 AM  Radiology CT Angio Chest PE W and/or Wo Contrast  Result Date: 05/16/2021 CLINICAL DATA:  PE suspected, low/intermediate prob, positive D-dimer EXAM: CT ANGIOGRAPHY CHEST WITH CONTRAST TECHNIQUE:  Multidetector CT imaging of the chest was performed using the standard protocol during bolus administration of intravenous contrast. Multiplanar CT image reconstructions and MIPs were obtained to evaluate the vascular anatomy. CONTRAST:  OMNIPAQUE IOHEXOL 350 MG/ML SOLN COMPARISON:  Multiple priors, most recent May 12 21. FINDINGS: Cardiovascular: Limited evaluation due to motion, beam hardening artifact and contrast bolus timing with possible thrombus in distal segmental right lower lobe pulmonary artery branch (for example series 5, image 97). No central pulmonary embolism. Unremarkable aorta. Mild cardiomegaly. Mediastinum/Nodes: No enlarged mediastinal, hilar, or axillary lymph nodes. Thyroid gland, trachea, and esophagus demonstrate no significant findings. Lungs/Pleura: Mild dependent ground-glass opacities, likely atelectasis. No confluent consolidation. No pneumothorax or pleural effusions. Upper Abdomen: No acute abnormality. Musculoskeletal: No chest wall abnormality. No acute or significant osseous findings. Review of the MIP images confirms the above findings. IMPRESSION: 1. Limited evaluation due to motion, beam hardening artifact and contrast bolus timing with possible thrombus in distal segmental right lower lobe pulmonary artery branch. No central pulmonary embolus. 2. Mild dependent atelectasis.  Otherwise, lungs are clear. Electronically Signed   By: Feliberto Harts MD   On: 05/16/2021 13:09   DG Chest Portable 1 View  Result Date: 05/16/2021 CLINICAL DATA:  dyspnea, hx saddle pe EXAM: PORTABLE CHEST 1 VIEW COMPARISON:  Radiograph 02/21/2020, chest CT 02/21/2020 FINDINGS: Cardiac silhouette is at the upper limits of normal, unchanged. There is no new focal airspace disease. There is no large pleural effusion or visible pneumothorax. There is no acute osseous abnormality. IMPRESSION: No focal airspace disease. Electronically Signed   By: Caprice Renshaw   On: 05/16/2021 09:38     Procedures Procedures   Medications Ordered in ED Medications  0.9 %  sodium chloride infusion (has no administration in time range)  bebtelovimab EUA injection SOLN 175 mg (has no administration in time range)  diphenhydrAMINE (BENADRYL) injection 50 mg (has no administration in time range)  famotidine (PEPCID) IVPB 20 mg premix (has no administration in time range)  methylPREDNISolone sodium succinate (SOLU-MEDROL) 125 mg/2 mL injection 125  mg (has no administration in time range)  albuterol (VENTOLIN HFA) 108 (90 Base) MCG/ACT inhaler 2 puff (has no administration in time range)  EPINEPHrine (EPI-PEN) injection 0.3 mg (has no administration in time range)  sodium chloride 0.9 % bolus 1,000 mL (0 mLs Intravenous Stopped 05/16/21 1035)  ketorolac (TORADOL) 15 MG/ML injection 15 mg (15 mg Intravenous Given 05/16/21 0942)  ibuprofen (ADVIL) tablet 800 mg (800 mg Oral Given 05/16/21 1329)  sodium chloride 0.9 % bolus 1,000 mL (0 mLs Intravenous Stopped 05/16/21 1328)  iohexol (OMNIPAQUE) 350 MG/ML injection 100 mL (100 mLs Intravenous Contrast Given 05/16/21 1219)    ED Course  I have reviewed the triage vital signs and the nursing notes.  Pertinent labs & imaging results that were available during my care of the patient were reviewed by me and considered in my medical decision making (see chart for details).  Clinical Course as of 05/16/21 1405  Fri May 16, 2021  1123 EKG reviewed, normal sinus rhythm, no STEMI, no acute ischemia.  [SG]    Clinical Course User Index [SG] Sloan Leiter, DO   MDM Rules/Calculators/A&P                          CRITICAL CARE Performed by: Sloan Leiter   Total critical care time: 32 minutes  Critical care time was exclusive of separately billable procedures and treating other patients.  Critical care was necessary to treat or prevent imminent or life-threatening deterioration.  Critical care was time spent personally by me on the following  activities: development of treatment plan with patient and/or surrogate as well as nursing, discussions with consultants, evaluation of patient's response to treatment, examination of patient, obtaining history from patient or surrogate, ordering and performing treatments and interventions, ordering and review of laboratory studies, ordering and review of radiographic studies, pulse oximetry and re-evaluation of patient's condition.       This is a 35 yo well appearing male presenting to the ED for complaints as above. Tachycardic, otherwise Vital signs are stable upon arrival. No respiratory distress.   History of PE/DVT, on Eliquis with good compliance. Report of dyspnea. Moderate risk WELL's core. D-dimer is elevated so will obtain CTPE.  Patient found to be positive for COVID19, he is unvaccinated. He also has mild AKI. Given IVF. His body aches/headache have improved following medications in the ER.   CTPE reviewed; this does demonstrate possible segmental PE despite good compliance to eliquis. Patient has failed outpatient anticoagulation. He has been evaluated by Heme/Onc in the past 2/2 unprovoked saddle PE Dr Clelia Croft. PE today may be attributed to COVID19 infection. Will start heparin as he has failed o/p anticoagulation at this time.  Given failure of outpatient anti-coagulation; recommend admission for hematology evaluation and further anti-coagulation. Patient would likely also benefit from treatment for his COVID19 with [facility]  D/w hospitalist team who accepts patient for admission.    Final Clinical Impression(s) / ED Diagnoses Final diagnoses:  Mild dehydration  AKI (acute kidney injury) (HCC)  COVID-19  Anticoagulated  Single subsegmental pulmonary embolism without [facility])    Rx / DC Orders ED Discharge Orders          Ordered    MyChart COVID-19 home monitoring program        05/16/21 1229    Temperature monitoring        05/16/21 1229     acetaminophen (TYLENOL) 325 MG tablet  Every 6 hours PRN,   Status:  Discontinued        05/16/21 1231    guaiFENesin (ROBITUSSIN) 100 MG/5ML SOLN  Every 4 hours PRN,   Status:  Discontinued        05/16/21 1231             Sloan Leiter, DO 05/16/21 1405

## 2021-05-16 NOTE — ED Notes (Signed)
Resting comfortably, wife at [facility], VSS, alert, NAD, calm, interactive.

## 2021-05-16 NOTE — ED Notes (Signed)
MD at BS

## 2021-05-17 DIAGNOSIS — E871 Hypo-osmolality and hyponatremia: Secondary | ICD-10-CM | POA: Diagnosis present

## 2021-05-17 DIAGNOSIS — N179 Acute kidney failure, unspecified: Secondary | ICD-10-CM | POA: Diagnosis present

## 2021-06-22 IMAGING — US IR ANGIO/PULMON/BI
1 series · 3 of 3 positions shown · non-contrast
Comparison: CTA from same day

INDICATION: Submassive bilateral central pulmonary emboli with pulmonary
arterial hypertension, RV strain, shortness of breath, tachycardia.
Hypertension.

EXAM:
BILATERAL PULMONARY ARTERIOGRAM
LEFT PULMONARY ARTERIAL THROMBECTOMY
RIGHT PULMONARY ARTERIAL THROMBECTOMY
ULTRASOUND GUIDANCE FOR VASCULAR ACCESS
INFERIOR VENA CAVOGRAM
TECHNIQUE: Informed written consent was obtained from the patient after a
thorough discussion of the procedural risks, benefits and
alternatives. All questions were addressed. Maximal Sterile Barrier
Technique was utilized including caps, mask, sterile gowns, sterile
gloves, sterile drape, hand hygiene and skin antiseptic. A timeout
was performed prior to the initiation of the procedure.

[Series 1: ir angio/pulmon/bi · 3 of 3 slices shown]
[im 1/3]
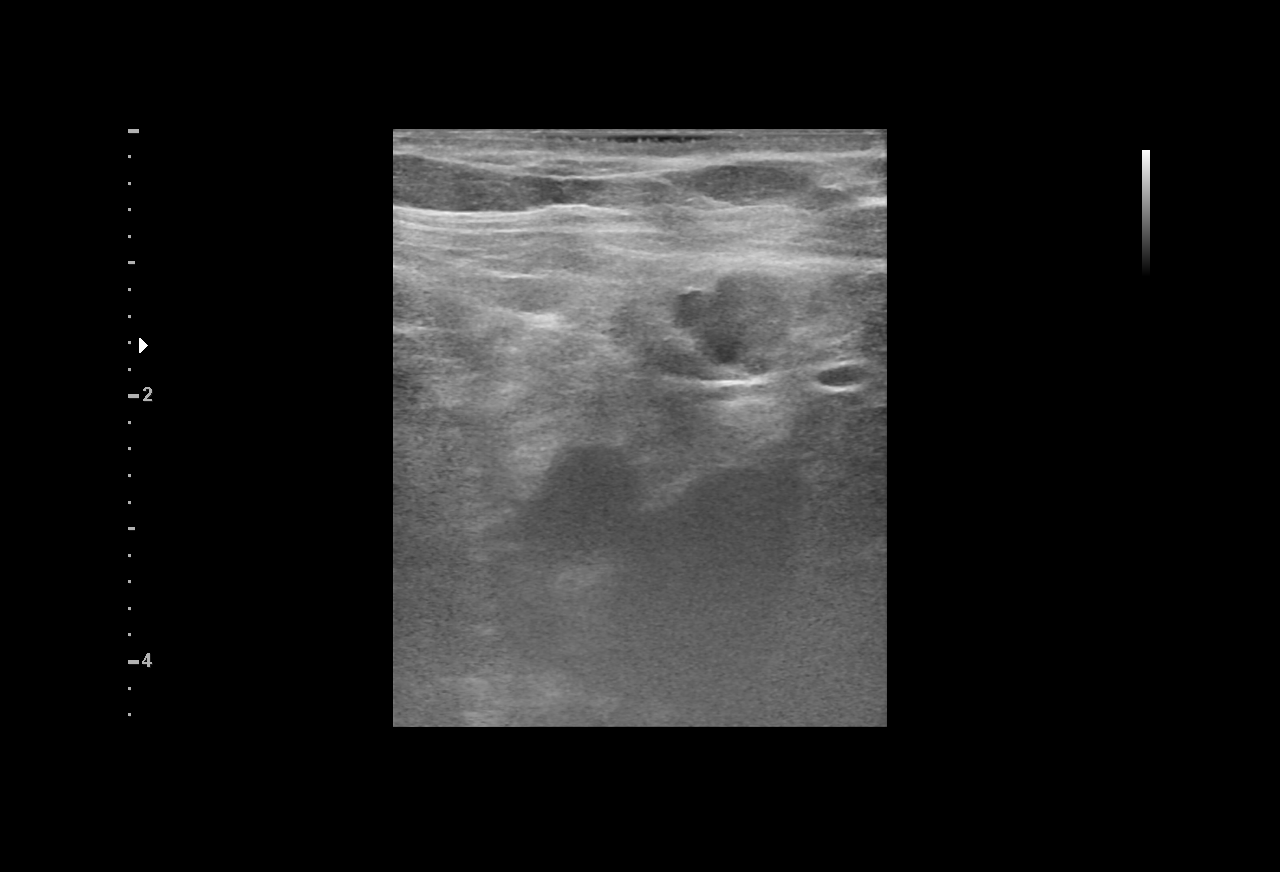
[im 2/3]
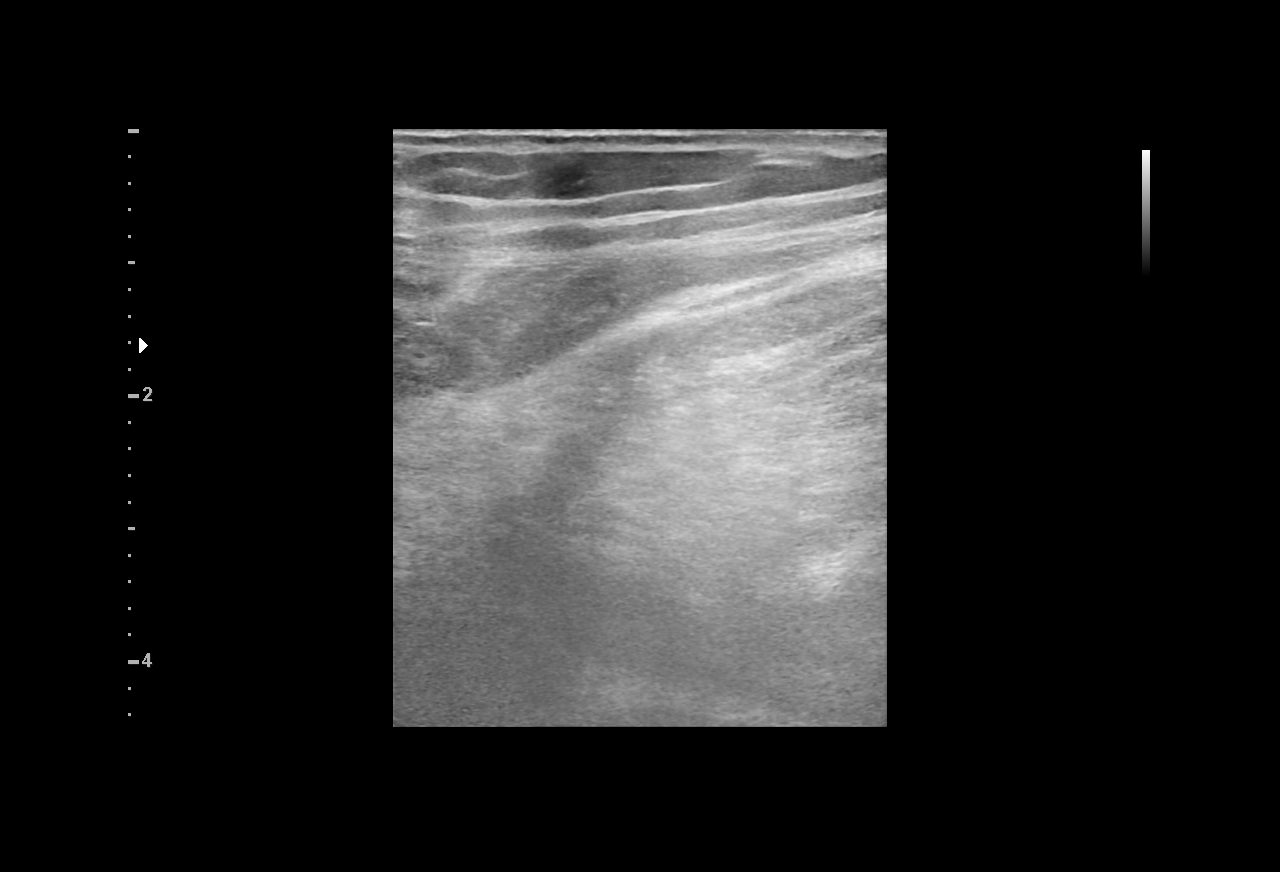
[im 3/3]
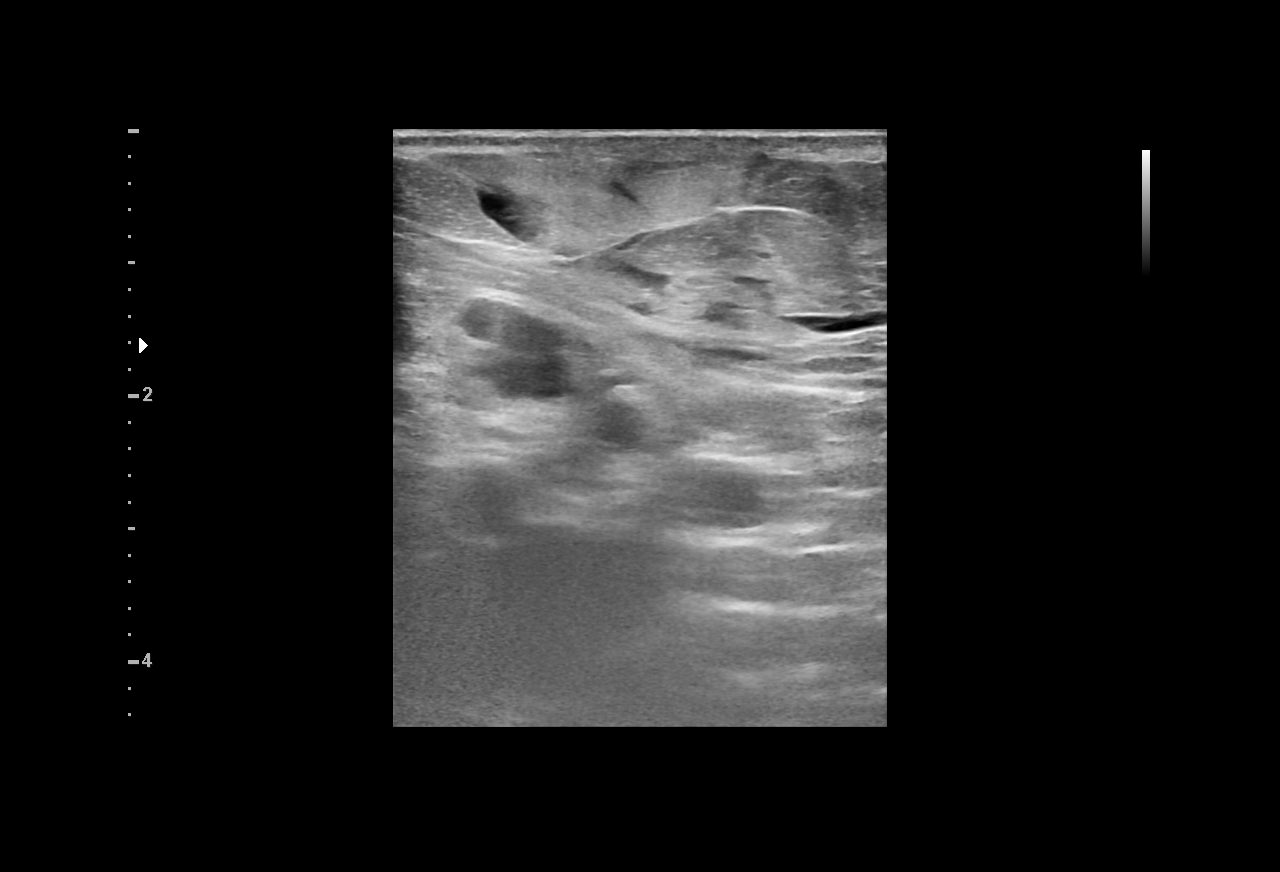

[3 of 3 positions shown; findings below may reference images not displayed]

MEDICATIONS:
Lidocaine 1% subcutaneous, heparin 5555 units

ANESTHESIA/SEDATION:
Intravenous Fentanyl 01mcg and Versed 1.5mg were administered as
conscious sedation during continuous monitoring of the patient's
level of consciousness and physiological / cardiorespiratory status
by the radiology RN, with a total moderate sedation time of 90
minutes.
Patency and complete compressibility of bilateral common femoral
veins was confirmed with ultrasound and documented. After surgical
prep and local lidocaine administration, micropuncture access to
right common femoral vein achieved under ultrasound guidance,
exchanged over a Bentson wire for a 7 French vascular sheath.
Outflow right iliac vein and inferior cavogram performed. 6 French
angled pigtail catheter advanced into the right pulmonary artery.
Pressure measurements obtained. Pulmonary arteriogram performed.
Patient was heparinized with 6222 units heparin, and ACT was
maintained greater than 200 for the remainder of the procedure, with
additional intermittent boluses as needed. Catheter exchanged for a
5 French vert catheter, negotiated into lower lobe branch right
pulmonary artery, removed over a stiff Amplatz wire. Vascular sheath
was upsized, and 24 French FlowTriever device advanced into distal
interlobar branch right pulmonary artery. Suction thrombectomy
performed. Follow-up selective right pulmonary arteriography was
obtained. The catheter was then redirected to the origin of the left
pulmonary artery for left pulmonary arteriography. The 5 French vert
catheter was coaxially advanced for additional selective left
pulmonary arteriography. FlowTriever device advanced into distal
left pulmonary artery. Suction mechanical thrombectomy performed.
Follow-up selective left pulmonary arteriogram obtained. Catheter
was retracted into the distal main pulmonary artery for final
pulmonary arteriography and pressure measurements. Guidewire,
catheter and sheath were removed and hemostasis achieved with aid of
0 Prolene pursestring suture. The patient tolerated the procedure
well.

FLUOROSCOPY TIME:  25.8 minutes; [id number] uNymJ DAP

COMPLICATIONS:
None immediate.
FINDINGS: Outflow right iliac venogram and inferior vena cavagram show no
iliocaval thrombus or occlusion.

Initial right pulmonary arterial pressure 68/40(53) mmHg.

Selective right pulmonary arteriogram shows saddle embolus across
its bifurcation with partially occlusive clot extending into the
intralobar branch and lower lobe segmental branches.

After right pulmonary arterial selective suction thrombectomy, there
is clearance of the central clot with some residual partially
occlusive clot in distal interlobar and lower lobe segmental
branches.

Selective left pulmonary arteriogram demonstrates near occlusive
thrombus in the proximal left pulmonary artery extending through its
length.

After left pulmonary artery selective suction thrombectomy,
clearance of thrombus from the left pulmonary artery with improved
branch perfusion.

Final pulmonary arterial pressure 32/6(19) mmHg.
IMPRESSION: 1. Negative for right iliac or inferior vena cava thrombus.
2. Bilateral central pulmonary emboli with markedly elevated
pulmonary arterial pressures.
3. Good response to selective right and left pulmonary arterial
thrombectomy, with near normalization of pulmonary arterial
pressures.

## 2021-09-15 ENCOUNTER — Other Ambulatory Visit: Payer: Self-pay | Admitting: *Deleted

## 2021-09-15 MED ORDER — APIXABAN 5 MG PO TABS: 5.0000 mg | ORAL_TABLET | Freq: Two times a day (BID) | ORAL | 2 refills | Status: DC

## 2021-09-15 NOTE — Telephone Encounter (Signed)
Brantlee left a message at another provider's desk stating he needs a refill of Eliquis. Dr Clelia Croft wants a confirmatiom of dose he is taking.  RN LM for Ashlin to call us back with the dose.

## 2021-09-16 ENCOUNTER — Telehealth: Payer: Self-pay

## 2021-09-16 ENCOUNTER — Emergency Department (HOSPITAL_COMMUNITY)
Admission: EM | Admit: 2021-09-16 | Discharge: 2021-09-16 | Disposition: A | Discharge status: Home / Self Care | Payer: BC Managed Care – PPO | Attending: Emergency Medicine | Admitting: Emergency Medicine

## 2021-09-16 ENCOUNTER — Emergency Department (HOSPITAL_COMMUNITY): Payer: BC Managed Care – PPO

## 2021-09-16 ENCOUNTER — Other Ambulatory Visit: Payer: Self-pay

## 2021-09-16 ENCOUNTER — Encounter (HOSPITAL_COMMUNITY): Payer: Self-pay | Admitting: Emergency Medicine

## 2021-09-16 DIAGNOSIS — R0789 Other chest pain: Secondary | ICD-10-CM | POA: Insufficient documentation

## 2021-09-16 DIAGNOSIS — R0602 Shortness of breath: Secondary | ICD-10-CM | POA: Insufficient documentation

## 2021-09-16 DIAGNOSIS — Z7901 Long term (current) use of anticoagulants: Secondary | ICD-10-CM | POA: Insufficient documentation

## 2021-09-16 DIAGNOSIS — Z79899 Other long term (current) drug therapy: Secondary | ICD-10-CM | POA: Insufficient documentation

## 2021-09-16 DIAGNOSIS — Z8616 Personal history of COVID-19: Secondary | ICD-10-CM | POA: Insufficient documentation

## 2021-09-16 DIAGNOSIS — Z87891 Personal history of nicotine dependence: Secondary | ICD-10-CM | POA: Insufficient documentation

## 2021-09-16 DIAGNOSIS — I10 Essential (primary) hypertension: Secondary | ICD-10-CM | POA: Diagnosis not present

## 2021-09-16 LAB — BASIC METABOLIC PANEL
Anion gap: 8 (ref 5–15)
BUN: 17 mg/dL (ref 6–20)
CO2: 24 mmol/L (ref 22–32)
Calcium: 9.1 mg/dL (ref 8.9–10.3)
Chloride: 106 mmol/L (ref 98–111)
Creatinine, Ser: 1.12 mg/dL (ref 0.61–1.24)
GFR, Estimated: >60 mL/min (ref >60)
Glucose, Bld: 108 mg/dL — ABNORMAL HIGH (ref 70–99)
Potassium: 4 mmol/L (ref 3.5–5.1)
Sodium: 138 mmol/L (ref 135–145)

## 2021-09-16 LAB — TROPONIN I (HIGH SENSITIVITY)
Troponin I (High Sensitivity): 4 ng/L (ref <18)
Troponin I (High Sensitivity): 3 ng/L (ref <18)

## 2021-09-16 LAB — CBC
HCT: 43.4 % (ref 39.0–52.0)
Hemoglobin: 13.8 g/dL (ref 13.0–17.0)
MCH: 28.8 pg (ref 26.0–34.0)
MCHC: 31.8 g/dL (ref 30.0–36.0)
MCV: 90.4 fL (ref 80.0–100.0)
Platelets: 251 10*3/uL (ref 150–400)
RBC: 4.8 MIL/uL (ref 4.22–5.81)
RDW: 13.1 % (ref 11.5–15.5)
WBC: 7.8 10*3/uL (ref 4.0–10.5)
nRBC: 0 % (ref 0.0–0.2)

## 2021-09-16 MED ORDER — IOHEXOL 350 MG/ML SOLN
100.0000 mL | Freq: Once | INTRAVENOUS | Status: AC | PRN
  Administered 2021-09-16: 100 mL via INTRAVENOUS

## 2021-09-16 MED ORDER — APIXABAN 5 MG PO TABS: 5.0000 mg | ORAL_TABLET | Freq: Two times a day (BID) | ORAL | 0 refills | Status: DC

## 2021-09-16 NOTE — ED Provider Notes (Signed)
[facility] EMERGENCY DEPARTMENT Provider Note   CSN: [id number] Arrival date & time: 09/16/21  1696     History Chief Complaint  Patient presents with   Chest Pain   Shortness of Breath    Riley Wells is a 35 y.o. male.  35 year old male with past medical history of DVT and PE presents with complaint of chest tightness and shortness of breath.  Patient states that he started to run out of his Eliquis about a month ago and has been rationing, recently took his last tablet, has been unable to refill due to a shortage.  Patient states that last night while in the shower he noticed that he felt short of breath.  This resolved with rest and patient slept all night.  Patient woke up at 5 AM per usual to get ready for work, states after he started walking around he noticed that he had some tightness in his chest and felt short of breath.  Reports recurrent chest discomfort if he presses on his chest, short of breath with exertion.  Denies lower extremity edema or pain.  No other complaints or concerns.      Past Medical History:  Diagnosis Date   History of DVT (deep vein thrombosis)    PE (pulmonary thromboembolism) (HCC)     Patient Active Problem List   Diagnosis Date Noted   Hyponatremia 05/17/2021   AKI (acute kidney injury) (HCC) 05/17/2021   COVID-19 virus infection 05/16/2021   Obesity, Class III, BMI 40-49.9 (morbid obesity) (HCC) 05/16/2021   VTE (venous thromboembolism) 03/07/2020   Former smoker 03/07/2020   OSA on CPAP 03/07/2020   Pulmonary HTN (HCC) 03/07/2020   Obesity 02/23/2020   History of DVT (deep vein thrombosis) 02/23/2020   History of pulmonary embolus (PE) 02/21/2020    Past Surgical History:  Procedure Laterality Date   IR ANGIOGRAM PULMONARY BILATERAL SELECTIVE  02/21/2020   IR ANGIOGRAM SELECTIVE EACH ADDITIONAL VESSEL  02/21/2020   IR ANGIOGRAM SELECTIVE EACH ADDITIONAL VESSEL  02/21/2020   IR THROMBECT VENO MECH MOD SED   02/21/2020   IR US GUIDE VASC ACCESS RIGHT  02/21/2020       History reviewed. No pertinent family history.  Social History   Tobacco Use   Smoking status: Former    Packs/day: 1.00    Types: Cigarettes    Start date: 2001    Quit date: 05/21/2020    Years since quitting: 1.3   Smokeless tobacco: Never  Vaping Use   Vaping Use: Former   Quit date: 01/29/2020  Substance Use Topics   Alcohol use: Never   Drug use: Never    Home Medications Prior to Admission medications   Medication Sig Start Date End Date Taking? Authorizing Provider  acetaminophen (TYLENOL) 500 MG tablet Take 1 tablet (500 mg total) by mouth every 6 (six) hours as needed for mild pain, headache or fever. 05/16/21   Clydie Braun, MD  albuterol (VENTOLIN HFA) 108 (90 Base) MCG/ACT inhaler Inhale 2 puffs into the lungs once as needed for wheezing or shortness of breath (for Grade 3 or 4 hypersensitivity reaction). 05/16/21   Clydie Braun, MD  apixaban (ELIQUIS) 5 MG TABS tablet Take 1 tablet (5 mg total) by mouth 2 (two) times daily. Start after completing the Eliquis Starter Pack 09/16/21   Jeannie Fend, PA-C  ascorbic acid (VITAMIN C) 500 MG tablet Take 1 tablet (500 mg total) by mouth daily. 05/17/21   Madelyn Flavors  A, MD  zinc sulfate 220 (50 Zn) MG capsule Take 1 capsule (220 mg total) by mouth daily. 05/17/21   Clydie Braun, MD  hydrochlorothiazide (HYDRODIURIL) 25 MG tablet Take 1 tablet (25 mg total) by mouth daily. 02/25/20 05/16/21  Narda Bonds, MD    Allergies    Patient has no known allergies.  Review of Systems   Review of Systems  Constitutional:  Negative for chills, diaphoresis and fever.  Respiratory:  Positive for chest tightness and shortness of breath.   Cardiovascular:  Negative for palpitations and leg swelling.  Gastrointestinal:  Negative for abdominal pain, nausea and vomiting.  Musculoskeletal:  Negative for arthralgias and myalgias.  Skin:  Negative for rash and wound.   Allergic/Immunologic: Negative for immunocompromised state.  Neurological:  Negative for weakness.  Hematological:  Does not bruise/bleed easily.  Psychiatric/Behavioral:  Negative for confusion.   All other systems reviewed and are negative.  Physical Exam Updated Vital Signs BP (!) 120/94   Pulse 68   Temp 98.3 F (36.8 C) (Oral)   Resp 19   SpO2 100%   Physical Exam Vitals and nursing note reviewed.  Constitutional:      General: He is not in acute distress.    Appearance: He is well-developed. He is obese. He is not diaphoretic.  HENT:     Head: Normocephalic and atraumatic.  Cardiovascular:     Rate and Rhythm: Normal rate and regular rhythm.     Heart sounds: Normal heart sounds.  Pulmonary:     Effort: Pulmonary effort is normal.     Breath sounds: Normal breath sounds.  Chest:     Chest wall: No tenderness.  Abdominal:     Palpations: Abdomen is soft.     Tenderness: There is no abdominal tenderness.  Musculoskeletal:     Cervical back: Neck supple.     Right lower leg: No tenderness. No edema.     Left lower leg: No tenderness. No edema.  Skin:    General: Skin is warm and dry.     Findings: No erythema or rash.  Neurological:     Mental Status: He is alert and oriented to person, place, and time.  Psychiatric:        Behavior: Behavior normal.    ED Results / Procedures / Treatments   Labs (all labs ordered are listed, but only abnormal results are displayed) Labs Reviewed  BASIC METABOLIC PANEL - Abnormal; Notable for the following components:      Result Value   Glucose, Bld 108 (*)    All other components within normal limits  CBC  TROPONIN I (HIGH SENSITIVITY)  TROPONIN I (HIGH SENSITIVITY)    EKG None  Radiology DG Chest 2 View  Result Date: 09/16/2021 CLINICAL DATA:  CP EXAM: CHEST - 2 VIEW COMPARISON:  05/16/2021. FINDINGS: No consolidation. No visible pleural effusions or pneumothorax. Cardiomediastinal silhouette is within normal  limits and similar to prior. No evidence of acute osseous abnormality IMPRESSION: No evidence of acute cardiopulmonary disease. Electronically Signed   By: Feliberto Harts M.D.   On: 09/16/2021 07:28   CT Angio Chest PE W/Cm &/Or Wo Cm  Result Date: 09/16/2021 CLINICAL DATA:  PE suspected.  Chest pain and shortness of breath. EXAM: CT ANGIOGRAPHY CHEST WITH CONTRAST TECHNIQUE: Multidetector CT imaging of the chest was performed using the standard protocol during bolus administration of intravenous contrast. Multiplanar CT image reconstructions and MIPs were obtained to evaluate the vascular anatomy. CONTRAST:  OMNIPAQUE IOHEXOL 350 MG/ML SOLN COMPARISON:  05/16/2021 FINDINGS: Cardiovascular: Satisfactory opacification of the pulmonary arteries to the segmental level. No evidence of pulmonary embolism. Normal heart size. No pericardial effusion. Mediastinum/Nodes: No enlarged mediastinal, hilar, or axillary lymph nodes. Thyroid gland, trachea, and esophagus demonstrate no significant findings. Lungs/Pleura: No pleural effusion, airspace consolidation, atelectasis or pneumothorax. No suspicious lung nodules identified. Upper Abdomen: No acute abnormality. Musculoskeletal: No chest wall abnormality. No acute or significant osseous findings. Review of the MIP images confirms the above findings. IMPRESSION: Negative exam. No evidence for acute pulmonary embolus. Electronically Signed   By: Signa Kell M.D.   On: 09/16/2021 09:48    Procedures Procedures   Medications Ordered in ED Medications  iohexol (OMNIPAQUE) 350 MG/ML injection 100 mL (100 mLs Intravenous Contrast Given 09/16/21 6546)    ED Course  I have reviewed the triage vital signs and the nursing notes.  Pertinent labs & imaging results that were available during my care of the patient were reviewed by me and considered in my medical decision making (see chart for details).  Clinical Course as of 09/16/21 1150  Tue Sep 16, 2021   [id number] 35 year old male with history of PE and DVT presents with complaint of chest tightness and shortness of breath as above.  Patient has been rationing his Eliquis as he has been unable to get his medication refilled at his pharmacy.  On exam, is overall well-appearing.  Lungs clear to auscultation, not tachycardic, vitals unremarkable with room air O2 sat 100%.  No lower extremity swelling or calf pain. Work-up is overall reassuring, CBC and BMP without significant findings.  Initial troponin is 4, pending repeat.  CTA of the chest is negative for PE or other acute findings.  EKG without ischemic changes.  Patient's Eliquis is refilled at a different pharmacy.  If his repeat troponin is unchanged, he will be discharged to follow-up with his PCP. [LM]    Clinical Course User Index [LM] Alden Hipp   MDM Rules/Calculators/A&P                           Final Clinical Impression(s) / ED Diagnoses Final diagnoses:  Shortness of breath  Atypical chest pain    Rx / DC Orders ED Discharge Orders          Ordered    apixaban (ELIQUIS) 5 MG TABS tablet  2 times daily,   Status:  Discontinued        09/16/21 1026    apixaban (ELIQUIS) 5 MG TABS tablet  2 times daily        09/16/21 1027             Alden Hipp 09/16/21 1150    Virgina Norfolk, DO 09/16/21 1152

## 2021-09-16 NOTE — Discharge Instructions (Signed)
Take your Eliquis as prescribed.  This was able to be filled at [facility] today.  Recheck with your primary care provider.  Return to the ER for worsening or concerning symptoms.

## 2021-09-16 NOTE — ED Notes (Signed)
Pt transported to CT ?

## 2021-09-16 NOTE — Telephone Encounter (Signed)
Thanks, pls route this message to Dr. Clelia Croft and his nurse

## 2021-09-16 NOTE — Telephone Encounter (Signed)
Called and given below message to [place]. He verbalized understanding. His insurance will only pay for Rx to be filled at [facility] and they currently do not have Eliquis Rx. He will call and schedule appt with Dr. Clelia Croft to discuss options.

## 2021-09-16 NOTE — Telephone Encounter (Signed)
Returned wife's call. She said that she left message Friday and no one called back. Dr. Bertis Ruddy saw Riley Wells in the ER on 8/5 and gave them her card. She said to call if they ever had issues with Eliquis. Insurance is not paying for the Eliquis Rx. They are asking for assistance with Rx.  Or appt? They went to the ER today also.

## 2021-09-16 NOTE — ED Notes (Signed)
Patient verbalizes understanding of discharge instructions. Opportunity for questioning and answers were provided. Armband removed by staff, pt discharged from ED and ambulated to lobby to return home with family.  

## 2021-09-16 NOTE — ED Triage Notes (Addendum)
Pt presents to ED POV. Pt c/o CP and SOB that began this morning. Pt reports that he is no eliquis but rx ran out and has not taken it x2w. Pt reports that last time he felt pain like this he had PE. Pt reports CP is center and non radiating.

## 2021-09-16 NOTE — Telephone Encounter (Signed)
He was originally a patient of Dr. Clelia Croft, I saw him once Was there a specific brand that insurance prefers? I think he should see Dr. Clelia Croft back to discuss alternative option

## 2023-04-19 ENCOUNTER — Observation Stay (HOSPITAL_COMMUNITY)
Admission: EM | Admit: 2023-04-19 | Discharge: 2023-04-20 | Disposition: A | Discharge status: Home / Self Care | Payer: Self-pay | Attending: Family Medicine | Admitting: Family Medicine

## 2023-04-19 ENCOUNTER — Other Ambulatory Visit: Payer: Self-pay

## 2023-04-19 ENCOUNTER — Encounter (HOSPITAL_COMMUNITY): Payer: Self-pay | Admitting: Emergency Medicine

## 2023-04-19 ENCOUNTER — Other Ambulatory Visit (HOSPITAL_COMMUNITY): Payer: Self-pay

## 2023-04-19 ENCOUNTER — Emergency Department (HOSPITAL_COMMUNITY): Payer: Self-pay

## 2023-04-19 DIAGNOSIS — I10 Essential (primary) hypertension: Secondary | ICD-10-CM | POA: Insufficient documentation

## 2023-04-19 DIAGNOSIS — F17200 Nicotine dependence, unspecified, uncomplicated: Secondary | ICD-10-CM | POA: Insufficient documentation

## 2023-04-19 DIAGNOSIS — I2694 Multiple subsegmental pulmonary emboli without acute cor pulmonale: Principal | ICD-10-CM | POA: Insufficient documentation

## 2023-04-19 DIAGNOSIS — Z86718 Personal history of other venous thrombosis and embolism: Secondary | ICD-10-CM | POA: Insufficient documentation

## 2023-04-19 DIAGNOSIS — Z1152 Encounter for screening for COVID-19: Secondary | ICD-10-CM | POA: Insufficient documentation

## 2023-04-19 DIAGNOSIS — I2699 Other pulmonary embolism without acute cor pulmonale: Secondary | ICD-10-CM

## 2023-04-19 DIAGNOSIS — Z79899 Other long term (current) drug therapy: Secondary | ICD-10-CM | POA: Insufficient documentation

## 2023-04-19 LAB — CBC
HCT: 41.8 % (ref 39.0–52.0)
Hemoglobin: 13.2 g/dL (ref 13.0–17.0)
MCH: 28.1 pg (ref 26.0–34.0)
MCHC: 31.6 g/dL (ref 30.0–36.0)
MCV: 89.1 fL (ref 80.0–100.0)
Platelets: 221 10*3/uL (ref 150–400)
RBC: 4.69 MIL/uL (ref 4.22–5.81)
RDW: 13.1 % (ref 11.5–15.5)
WBC: 7.9 10*3/uL (ref 4.0–10.5)
nRBC: 0 % (ref 0.0–0.2)

## 2023-04-19 LAB — RESP PANEL BY RT-PCR (RSV, FLU A&B, COVID)  RVPGX2
Influenza A by PCR: NEGATIVE
Influenza B by PCR: NEGATIVE
Resp Syncytial Virus by PCR: NEGATIVE
SARS Coronavirus 2 by RT PCR: NEGATIVE

## 2023-04-19 LAB — BASIC METABOLIC PANEL
Anion gap: 10 (ref 5–15)
BUN: 13 mg/dL (ref 6–20)
CO2: 22 mmol/L (ref 22–32)
Calcium: 9.2 mg/dL (ref 8.9–10.3)
Chloride: 106 mmol/L (ref 98–111)
Creatinine, Ser: 1.22 mg/dL (ref 0.61–1.24)
GFR, Estimated: >60 mL/min (ref >60)
Glucose, Bld: 111 mg/dL — ABNORMAL HIGH (ref 70–99)
Potassium: 4.2 mmol/L (ref 3.5–5.1)
Sodium: 138 mmol/L (ref 135–145)

## 2023-04-19 LAB — APTT: aPTT: 29 seconds (ref 24–36)

## 2023-04-19 LAB — HIV ANTIBODY (ROUTINE TESTING W REFLEX): HIV Screen 4th Generation wRfx: NONREACTIVE

## 2023-04-19 LAB — TROPONIN I (HIGH SENSITIVITY): Troponin I (High Sensitivity): 4 ng/L (ref <18)

## 2023-04-19 LAB — HEPARIN LEVEL (UNFRACTIONATED)
Heparin Unfractionated: <0.1 IU/mL — ABNORMAL LOW (ref 0.30–0.70)
Heparin Unfractionated: 0.75 IU/mL — ABNORMAL HIGH (ref 0.30–0.70)

## 2023-04-19 MED ORDER — HEPARIN (PORCINE) 25000 UT/250ML-% IV SOLN
2100.0000 [IU]/h | INTRAVENOUS | Status: DC
  Administered 2023-04-19: 2350 [IU]/h via INTRAVENOUS
  Administered 2023-04-19: 2500 [IU]/h via INTRAVENOUS
  Administered 2023-04-20: 2100 [IU]/h via INTRAVENOUS
  Filled 2023-04-19 (×3): qty 250

## 2023-04-19 MED ORDER — ONDANSETRON HCL 4 MG PO TABS: 4.0000 mg | ORAL_TABLET | Freq: Four times a day (QID) | ORAL | Status: DC | PRN

## 2023-04-19 MED ORDER — ACETAMINOPHEN 325 MG PO TABS: 650.0000 mg | ORAL_TABLET | Freq: Four times a day (QID) | ORAL | Status: DC | PRN

## 2023-04-19 MED ORDER — IOHEXOL 350 MG/ML SOLN
100.0000 mL | Freq: Once | INTRAVENOUS | Status: AC | PRN
  Administered 2023-04-19: 100 mL via INTRAVENOUS

## 2023-04-19 MED ORDER — ONDANSETRON HCL 4 MG/2ML IJ SOLN: 4.0000 mg | Freq: Four times a day (QID) | INTRAMUSCULAR | Status: DC | PRN

## 2023-04-19 MED ORDER — NICOTINE 14 MG/24HR TD PT24
14.0000 mg | MEDICATED_PATCH | Freq: Every day | TRANSDERMAL | Status: DC
  Administered 2023-04-19 – 2023-04-20 (×2): 14 mg via TRANSDERMAL
  Filled 2023-04-19 (×2): qty 1

## 2023-04-19 MED ORDER — POLYETHYLENE GLYCOL 3350 17 G PO PACK: 17.0000 g | PACK | Freq: Every day | ORAL | Status: DC | PRN

## 2023-04-19 MED ORDER — ACETAMINOPHEN 650 MG RE SUPP: 650.0000 mg | Freq: Four times a day (QID) | RECTAL | Status: DC | PRN

## 2023-04-19 MED ORDER — HEPARIN BOLUS VIA INFUSION
5000.0000 [IU] | Freq: Once | INTRAVENOUS | Status: AC
  Administered 2023-04-19: 5000 [IU] via INTRAVENOUS
  Filled 2023-04-19: qty 5000

## 2023-04-19 NOTE — ED Triage Notes (Signed)
Pt presents with SOB x 2 days. Pt has not had eliquis 5mg  in 2 months due to insurance issues. Pt states he feels the way he did in 2021 when he had a PE. Pt denies any pain at this time. SOB worse on exertion.

## 2023-04-19 NOTE — ED Notes (Signed)
ED TO INPATIENT HANDOFF REPORT  ED Nurse Name and Phone #:  Marcelino Duster [phone]  S Name/Age/Gender Riley Wells 37 y.o. male Room/Bed: 021C/021C  Code Status   Code Status: Full Code  Home/SNF/Other Home Patient oriented to: self, place, time, and situation Is this baseline? Yes   Triage Complete: Triage complete  Chief Complaint Pulmonary embolism (HCC) [I26.99]  Triage Note Pt presents with SOB x 2 days. Pt has not had eliquis 5mg  in 2 months due to insurance issues. Pt states he feels the way he did in 2021 when he had a PE. Pt denies any pain at this time. SOB worse on exertion.    Allergies No Known Allergies  Level of Care/Admitting Diagnosis ED Disposition     ED Disposition  Admit   Condition  --   Comment  Hospital Area: [facility] [[id number]]  Level of Care: Telemetry Medical [104]  May place patient in observation at [facility] or [facility] if equivalent level of care is available:: Yes  Covid Evaluation: Asymptomatic - no recent exposure (last 10 days) testing not required  Diagnosis: Pulmonary embolism (HCC) [241700]  Admitting Physician: Carney Living [[id number]  Attending Physician: Deirdre Priest, MARSHALL L [[id number]]          B Medical/Surgery History Past Medical History:  Diagnosis Date   History of DVT (deep vein thrombosis)    PE (pulmonary thromboembolism) (HCC)    Past Surgical History:  Procedure Laterality Date   IR ANGIOGRAM PULMONARY BILATERAL SELECTIVE  02/21/2020   IR ANGIOGRAM SELECTIVE EACH ADDITIONAL VESSEL  02/21/2020   IR ANGIOGRAM SELECTIVE EACH ADDITIONAL VESSEL  02/21/2020   IR THROMBECT VENO MECH MOD SED  02/21/2020   IR US GUIDE VASC ACCESS RIGHT  02/21/2020     A IV Location/Drains/Wounds Patient Lines/Drains/Airways Status     Active Line/Drains/Airways     Name Placement date Placement time Site Days   Peripheral IV 04/19/23 20 G Anterior;Distal;Right;Upper Arm 04/19/23  0911  Arm  less than  1   Incision (Closed) 02/21/20 Groin Right 02/21/20  1829  -- 1153            Intake/Output Last 24 hours No intake or output data in the 24 hours ending 04/19/23 1323  Labs/Imaging Results for orders placed or performed during the hospital encounter of 04/19/23 (from the past 48 hour(s))  Basic metabolic panel     Status: Abnormal   Collection Time: 04/19/23  8:56 AM  Result Value Ref Range   Sodium 138 135 - 145 mmol/L   Potassium 4.2 3.5 - 5.1 mmol/L   Chloride 106 98 - 111 mmol/L   CO2 22 22 - 32 mmol/L   Glucose, Bld 111 (H) 70 - 99 mg/dL    Comment: Glucose reference range applies only to samples taken after fasting for at least 8 hours.   BUN 13 6 - 20 mg/dL   Creatinine, Ser 4.54 0.61 - 1.24 mg/dL   Calcium 9.2 8.9 - 09.8 mg/dL   GFR, Estimated >11 >91 mL/min    Comment: (NOTE) Calculated using the CKD-EPI Creatinine Equation (2021)    Anion gap 10 5 - 15    Comment: Performed at [facility], [address], [place], [place] [address]  CBC     Status: None   Collection Time: 04/19/23  8:56 AM  Result Value Ref Range   WBC 7.9 4.0 - 10.5 K/uL   RBC 4.69 4.22 - 5.81 MIL/uL  Hemoglobin 13.2 13.0 - 17.0 g/dL   HCT 96.0 45.4 - 09.8 %   MCV 89.1 80.0 - 100.0 fL   MCH 28.1 26.0 - 34.0 pg   MCHC 31.6 30.0 - 36.0 g/dL   RDW 11.9 14.7 - 82.9 %   Platelets 221 150 - 400 K/uL   nRBC 0.0 0.0 - 0.2 %    Comment: Performed at [facility], [address], [place], [place] [address]  Troponin I (High Sensitivity)     Status: None   Collection Time: 04/19/23  9:38 AM  Result Value Ref Range   Troponin I (High Sensitivity) 4 <18 ng/L    Comment: (NOTE) Elevated high sensitivity troponin I (hsTnI) values and significant  changes across serial measurements may suggest ACS but many other  chronic and acute conditions are known to elevate hsTnI results.  Refer to the "Links" section for chest pain algorithms and additional  guidance. Performed at [facility], [address] [place], [place] [address]   Resp panel by RT-PCR (RSV, Flu A&B, Covid) Anterior Nasal Swab     Status: None   Collection Time: 04/19/23  9:39 AM   Specimen: Anterior Nasal Swab  Result Value Ref Range   SARS Coronavirus 2 by RT PCR NEGATIVE NEGATIVE   Influenza A by PCR NEGATIVE NEGATIVE   Influenza B by PCR NEGATIVE NEGATIVE    Comment: (NOTE) The Xpert Xpress SARS-CoV-2/FLU/RSV plus assay is intended as an aid in the diagnosis of influenza from Nasopharyngeal swab specimens and should not be used as a sole basis for treatment. Nasal washings and aspirates are unacceptable for Xpert Xpress SARS-CoV-2/FLU/RSV testing.  Fact Sheet for Patients: BloggerCourse.com  Fact Sheet for Healthcare Providers: SeriousBroker.it  This test is not yet approved or cleared by the [place] FDA and has been authorized for detection and/or diagnosis of SARS-CoV-2 by FDA under an Emergency Use Authorization (EUA). This EUA will remain in effect (meaning this test can be used) for the duration of the COVID-19 declaration under Section 564(b)(1) of the Act, 21 U.S.C. section 360bbb-3(b)(1), unless the authorization is terminated or revoked.     Resp Syncytial Virus by PCR NEGATIVE NEGATIVE    Comment: (NOTE) Fact Sheet for Patients: BloggerCourse.com  Fact Sheet for Healthcare Providers: SeriousBroker.it  This test is not yet approved or cleared by the [place] FDA and has been authorized for detection and/or diagnosis of SARS-CoV-2 by FDA under an Emergency Use Authorization (EUA). This EUA will remain in effect (meaning this test can be used) for the duration of the COVID-19 declaration under Section 564(b)(1) of the Act, 21 U.S.C. section 360bbb-3(b)(1), unless the authorization is terminated or revoked.  Performed at [facility], [address], [place], [place] [address]    CT Angio Chest PE W and/or Wo Contrast  Result Date: 04/19/2023 CLINICAL DATA:  Suspected pulmonary embolism EXAM: CT ANGIOGRAPHY CHEST WITH CONTRAST TECHNIQUE: Multidetector CT imaging of the chest was performed using the standard protocol during bolus administration of intravenous contrast. Multiplanar CT image reconstructions and MIPs were obtained to evaluate the vascular anatomy. RADIATION DOSE REDUCTION: This exam was performed according to the departmental dose-optimization program which includes automated exposure control, adjustment of the mA and/or kV according to patient size and/or use of iterative reconstruction technique. CONTRAST:  OMNIPAQUE IOHEXOL 350 MG/ML SOLN COMPARISON:  Chest x-ray April 19, 2023 FINDINGS: Cardiovascular: Satisfactory opacification of the pulmonary arteries to the segmental level. There  is multifocal diffuse acute pulmonary emboli involving bilateral lungs, located most centrally in bilateral distal main pulmonary arteries. There is extension of pulmonary emboli to the segmental and subsegmental pulmonary arteries in bilateral lungs. The main pulmonary artery is dilated, measuring up to 3.7 cm. There is slightly elevated RV to LV ratio, measuring up to 1.1. Normal heart size and no pericardial effusion. Mediastinum/Nodes: No enlarged mediastinal, hilar, or axillary lymph nodes. Thyroid gland, trachea, and esophagus demonstrate no significant findings. Lungs/Pleura: Patent central airways, with minimal layering debris in the distal trachea. No focal pulmonary opacity. No pleural effusion or pneumothorax. Upper Abdomen: No acute abnormality. Musculoskeletal: No chest wall abnormality. No acute or significant osseous findings. Review of the MIP images confirms the above findings. IMPRESSION: 1. Positive for acute pulmonary embolism, involving bilateral distal main pulmonary arteries and extending into the segmental and subsegmental pulmonary  arteries in bilateral lungs. 2. Slightly elevated RV to LV ratio and dilated main pulmonary artery, raising concern for right heart strain. These findings were discussed with Dr. Cristal Deer Tegeler by Dr. Jacob Moores at 11:29 am on 04/19/2023. Electronically Signed   By: Jacob Moores M.D.   On: 04/19/2023 11:30   DG Chest 2 View  Result Date: 04/19/2023 CLINICAL DATA:  Shortness of breath EXAM: CHEST - 2 VIEW COMPARISON:  Chest x-ray September 16, 2021 FINDINGS: The cardiomediastinal silhouette is unchanged in contour. No focal pulmonary opacity. No pleural effusion or pneumothorax. The visualized upper abdomen is unremarkable. No acute osseous abnormality. IMPRESSION: No active cardiopulmonary disease. Electronically Signed   By: Jacob Moores M.D.   On: 04/19/2023 09:42    Pending Labs Unresulted Labs (From admission, onward)     Start     Ordered   04/20/23 0500  Basic metabolic panel  Tomorrow morning,   R        04/19/23 1259   04/20/23 0500  CBC  Tomorrow morning,   R        04/19/23 1259   04/19/23 1256  HIV Antibody (routine testing w rflx)  (HIV Antibody (Routine testing w reflex) panel)  Once,   R        04/19/23 1259            Vitals/Pain Today's Vitals   04/19/23 0855 04/19/23 0859 04/19/23 1254  BP: 139/73  (!) 153/74  Pulse: 90  65  Resp: 15  19  Temp:  98.2 F (36.8 C) 98.2 F (36.8 C)  TempSrc:  Oral Oral  SpO2: 98%  99%  Weight: (!) 181.4 kg    Height: 6\' 2"  (1.88 m)    PainSc: 0-No pain      Isolation Precautions No active isolations  Medications Medications  acetaminophen (TYLENOL) tablet 650 mg (has no administration in time range)    Or  acetaminophen (TYLENOL) suppository 650 mg (has no administration in time range)  polyethylene glycol (MIRALAX / GLYCOLAX) packet 17 g (has no administration in time range)  ondansetron (ZOFRAN) tablet 4 mg (has no administration in time range)    Or  ondansetron (ZOFRAN) injection 4 mg (has no  administration in time range)  nicotine (NICODERM CQ - dosed in mg/24 hours) patch 14 mg (has no administration in time range)  iohexol (OMNIPAQUE) 350 MG/ML injection 100 mL (100 mLs Intravenous Contrast Given 04/19/23 1117)    Mobility walks     Focused Assessments Pulmonary Assessment Handoff:  Lung sounds:   O2 Device: Room Air      R Recommendations:  See Admitting Provider Note  Report given to:   Additional Notes: Patient alert and oriented, has history dvt and PE but has not taken eliqus d/t insurance issues.

## 2023-04-19 NOTE — Progress Notes (Signed)
  ANTICOAGULATION CONSULT NOTE - Initial Consult  Pharmacy Consult for IV heparin Indication: pulmonary embolus  No Known Allergies  Patient Measurements: Height: 6\' 2"  (188 cm) Weight: (!) 181.4 kg (400 lb) IBW/kg (Calculated) : 82.2 Heparin Dosing Weight: 181.4 kg  Vital Signs: Temp: 98.2 F (36.8 C) (07/08 1254) Temp Source: Oral (07/08 1254) BP: 153/74 (07/08 1254) Pulse Rate: 65 (07/08 1254)  Labs: Recent Labs    04/19/23 0856 04/19/23 0938  HGB 13.2  --   HCT 41.8  --   PLT 221  --   CREATININE 1.22  --   TROPONINIHS  --  4    Estimated Creatinine Clearance: 144.3 mL/min (by C-G formula based on SCr of 1.22 mg/dL).   Medical History: Past Medical History:  Diagnosis Date   History of DVT (deep vein thrombosis)    PE (pulmonary thromboembolism) (HCC)     Assessment: 37 yo M presents with ShOB found to have acute PE with concern for R heart strain. Pt has been unable to obtain eliquis 5mg  for past 2 months d/t cost. Given timeframe without eliquis doses, should not effect heparin level monitoring (anti-xa). Pharmacy consulted to start heparin gtt.   Goal of Therapy:  Heparin level 0.3-0.7 units/ml Monitor platelets by anticoagulation protocol: Yes   Plan:  Heparin 5000 units bolus IV followed by heparin gtt 2500 units/hr 6h heparin level  F/u Heparin level and CBC daily Monitor for plans for anticoagulation and financial assistance as able with DOAC therapy  Marja Kays 04/19/2023,1:26 PM

## 2023-04-19 NOTE — ED Notes (Signed)
Patient transported to x-ray. ?

## 2023-04-19 NOTE — Progress Notes (Signed)
FMTS Interim Progress Note  S: In to see the patient at bedside on nighttime rounds with Dr. Mliss Sax.  He says that he is feeling much better.  His breathing is easier, and he denies any chest pain.  He ate dinner without difficulty.  O: BP 132/75 (BP Location: Left Arm)   Pulse (!) 101   Temp 97.7 F (36.5 C) (Oral)   Resp 16   Ht 6\' 2"  (1.88 m)   Wt (!) 181.4 kg   SpO2 92%   BMI 51.36 kg/m   General: Resting in bed comfortably, pleasant CV: Regular rate rhythm Respiratory: Normal work of breathing on room air.  Lungs clear anteriorly  A/P: Acute pulmonary embolism Patient on room air.  SpO2 95%. -On IV heparin gtt, dosing per pharmacy -On fall and delirium chronic cautions -AM CBC/BMP  Labs and orders reviewed.  Ordered echocardiogram for the morning.    Darral Dash, DO 04/19/2023, 7:16 PM PGY-3, [facility] pager [phone]

## 2023-04-19 NOTE — Plan of Care (Signed)

## 2023-04-19 NOTE — Assessment & Plan Note (Signed)
Patient presented with SOB x2 days. CT showed bilateral PE and concern for right heart strain upon admission. - Heparin 5000 units bolus IV followed by heparin gtt 2500 units/hr - 6h heparin level - PT/OT to treat - Will contact CCM to figure out how long he should remain on heparin and if we need to load him with warfarin - TOC to determine DOAC coverage, if not will need warfarin

## 2023-04-19 NOTE — Assessment & Plan Note (Addendum)
Patient presented with SOB x2 days. CT showed bilateral PE and concern for right heart strain upon admission.  - Admit to FMTS, attending Dr. Deirdre Priest - Med-Telemetry, Vital signs per floor - Full diet  - PT/OT to treat - VTE prophylaxis: Heparin (will contact CCM to figure out how long he should remain on heparin and if we need to load him with warfarin) - AM CBC/BMP - Fall precautions - Delirium precautions

## 2023-04-19 NOTE — Hospital Course (Addendum)
Riley Wells is a 37 y.o.male with a history of PE, VTE, DVT, OSA on CPAP, Pulmonary HTN, and AKI who was admitted to the [facility] Medicine Teaching Service at [facility] for shortness of breath 2/2 acute PE.  His hospital course is detailed below:  Acute Pulmonary Embolism Patient presented to ED with shortness of breath x2 days. He had not been taking his Eliquis for 2 months due to affordability concerns. EKG without ischemic changes. CXR normal. Chest CT showed acute PE involving bilateral distal main pulmonary arteries and extending into the segmental and subsegmental pulmonary arteries in bilateral lungs, with concern for right heart strain. Patient received Heparin in the hospital and will be transitioned to Eliquis outpatient. Patient was provided with paperwork for additional assistance to help him afford Eliquis without insurance in the interim while he awaits medical insurance in September.  Smoking Cessation Patient was provided information about smoking cessation and would like to quit. Interested in using nicotine patches which he was provided in the hospital and would like to continue outpatient.   PCP Follow-up Recommendations: Assist patient if necessary to obtain approval for Eliquis supply to continue being adherent to Eliquis.

## 2023-04-19 NOTE — Assessment & Plan Note (Signed)
Patient would like to stop smoking. Has been smoking for 22 years and smokes about 1/2 pack per day. - TOC consulted for smoking cessation resources - Nicotine patch

## 2023-04-19 NOTE — H&P (Addendum)
Hospital Admission History and Physical Service Pager: [phone]  Patient name: Riley Wells Medical record number: [id number] Date of Birth: 09/11/1986 Age: 37 y.o. Gender: male  Primary Care Provider: Montez Hageman, DO Consultants: CCM in ED Code Status: Full code  Preferred Emergency Contact:  Contact Information     Name Relation Home Work Mobile   Veselka,Brittany Spouse [phone]  [phone]        Chief Complaint: SOB x2 days, noncompliant on Eliquis 5mg  for the past 2 months d/t insurance issues.  Assessment and Plan: Riley Wells is a 37 y.o. male presenting with SOB on exertion for the past two days, he is being admitted for a acute pulmonary embolism.  Differential for presentation of this includes MI, and ACS. PMHx includes PE, VTE, DVT, OSA on CPAP, Pulmonary HTN, Hyponatremia, AKI.  Patient denies any risk factors for provoked PE including long travel history or prolonged sitting, relating his acute PE secondary to not taking his Eliquis 5 mg for two months due to not having insurance. Strong family history of clotting disorders - grandfather on warfarin and his father died from a PE.   Prior history of DVT in 2016 which he completed a 6 month course of Xarelto.  He represented in 2021 with a bilateral PE that required mechanical thrombectomy and was told he needed to remain on lifetime Eliquis.  Unfortunately his wife lost medical insurance and they have been out of medication since May.  If we are unable to secure affordable DOAC patient will need to be started on warfarin.  Will reach out to [facility] for cost estimation, and if he needs to be started on warfarin will start immediately to begin bridging.    Hospital Problem List      Hospital     * (Principal) Acute pulmonary embolism [facility])     Patient presented with SOB x2 days. CT showed bilateral PE and concern  for right heart strain upon admission.  - Admit to FMTS, attending Dr. Deirdre Priest -  Med-Telemetry, Vital signs per floor - Full diet  - PT/OT to treat - Heparin 5000 units bolus IV followed by heparin gtt 2500 units/hr - 6h heparin level - Will contact CCM to figure out how long he should remain on heparin and  if we need to load him with warfarin - AM CBC/BMP - Fall precautions - Delirium precautions        Smoking     Patient would like to stop smoking. Has been smoking for 22 years and  smokes about 1/2 pack per day. - TOC consulted for smoking cessation resources - Nicotine patch      FEN/GI: Full diet VTE Prophylaxis: Heparin  Disposition: med-telemetry   History of Present Illness:  Riley Wells is a 37 y.o. male presenting with shortness of breath for 2 days at rest and with activity and reports not taking his Eliquis at home for 2 months due to not having insurance, which felt like the last time he had a PE which was in 2021 (severely tachypnic), however, this time was not as severe. Denies having any chest pain or palpitations this time was not as severe. He also describes that in 2017 he had a DVT which felt like a pulled muscle in his leg, but has not had these symptoms now.  Father died from PE. Grandfather on warfarin. He recalls a workup in the past but does not know any results.   Should have  insurance in September. He has only been taking 2 ASA 81 mg when he ran out of Eliquis.   In the ED, a CT was performed which was positive for acute pulmonary embolism, involving bilateral distal main pulmonary arteries and concern for right heart strain. CCM was consulted due to his previous PE requiring them to remove the clot, they said based on his CT they don't think this will be required this time and advised heparin anticoagulation.   Review Of Systems: Per HPI with the following additions: as above Denies: HA, blurry vision, abdominal apin, N/V/D, no pain in the legs.  Pertinent Past Medical History: H/O PE and DVT Remainder reviewed in history  tab.   Pertinent Past Surgical History: IR Thrombectomy 2021   Remainder reviewed in history tab.   Pertinent Social History: Tobacco use: Yes, smokes 1/2 ppd, started smoking when he was 37 yo Alcohol use: socially  Other Substance use: Mariajuana  Lives with wife.   Pertinent Family History: Grandfather was on warfarin, dad died from PE   Remainder reviewed in history tab.   Important Outpatient Medications: Albuterol 108 mcg/ACT inhaler Aspirin 81 mg BID - taking since being out of medications  Eliquis 5 mg BID - out for 2 months due to insurance   Remainder reviewed in medication history.   Objective: BP (!) 141/95 (BP Location: Left Arm)   Pulse 83   Temp 97.7 F (36.5 C) (Oral)   Resp 16   Ht 6\' 2"  (1.88 m)   Wt (!) 181.4 kg   SpO2 98%   BMI 51.36 kg/m   Exam: General: NAD. Alert and awake. ENTM: normal ears and nose. Cardiovascular: RRR. No M/R/G. Respiratory: CTAB. No wheezing, crackles, or rhonchi Gastrointestinal: soft, nontender, nondistended MSK: no BLE edema Derm: warm and dry.  Neuro: AAOx3. No focal neurological deficits. Psych: mood stable.   Labs:  CBC BMET  Recent Labs  Lab 04/19/23 0856  WBC 7.9  HGB 13.2  HCT 41.8  PLT 221   Recent Labs  Lab 04/19/23 0856  NA 138  K 4.2  CL 106  CO2 22  BUN 13  CREATININE 1.22  GLUCOSE 111*  CALCIUM 9.2     EKG: rate 83, NSR  Imaging Studies Performed: CXR -- 04/19/23 No active cardiopulmonary disease.  CTA - 04/19/23 1. Positive for acute pulmonary embolism, involving bilateral distal main pulmonary arteries and extending into the segmental and subsegmental pulmonary arteries in bilateral lungs.  2. Slightly elevated RV to LV ratio and dilated main pulmonary artery, raising concern for right heart strain.   Glendale Chard, DO 04/19/2023, 2:43 PM PGY-1, [facility]  FPTS Intern pager: [phone], text pages welcome Secure chat group [facility] Teaching  Service

## 2023-04-19 NOTE — Progress Notes (Signed)
ANTICOAGULATION CONSULT NOTE   Pharmacy Consult for IV heparin Indication: pulmonary embolus  No Known Allergies  Patient Measurements: Height: 6\' 2"  (188 cm) Weight: (!) 181.4 kg (400 lb) IBW/kg (Calculated) : 82.2 Heparin Dosing Weight: 126 kg  Vital Signs: Temp: 97.7 F (36.5 C) (07/08 1812) Temp Source: Oral (07/08 1812) BP: 132/75 (07/08 1812) Pulse Rate: 101 (07/08 1812)  Labs: Recent Labs    04/19/23 0856 04/19/23 0938 04/19/23 1310 04/19/23 1858  HGB 13.2  --   --   --   HCT 41.8  --   --   --   PLT 221  --   --   --   APTT  --   --  29  --   HEPARINUNFRC  --   --  <0.10* 0.75*  CREATININE 1.22  --   --   --   TROPONINIHS  --  4  --   --      Estimated Creatinine Clearance: 144.3 mL/min (by C-G formula based on SCr of 1.22 mg/dL).   Medical History: Past Medical History:  Diagnosis Date   History of DVT (deep vein thrombosis)    PE (pulmonary thromboembolism) (HCC)     Assessment: 37 yo M presents with ShOB found to have acute PE with concern for R heart strain. Pt has been unable to obtain eliquis 5mg  for past 2 months d/t cost. Given timeframe without eliquis doses, should not effect heparin level monitoring (anti-xa). Pharmacy consulted to start heparin gtt.   Heparin level slightly supratherapeutic (0.75) on infusion at 2500 units/hr. No bleeding noted.  Goal of Therapy:  Heparin level 0.3-0.7 units/ml Monitor platelets by anticoagulation protocol: Yes   Plan:  Decrease heparin infusion to 2350 units/hr F/u 6 hr heparin level  Christoper Fabian, PharmD, BCPS Please see amion for complete clinical pharmacist phone list 04/19/2023,8:19 PM

## 2023-04-19 NOTE — ED Provider Notes (Signed)
EMERGENCY DEPARTMENT AT [facility] Provider Note   CSN: [id number] Arrival date & time: 04/19/23  9147     History Chief Complaint  Patient presents with   Shortness of Breath     Shortness of Breath  Riley Wells is a 37 y.o. male presenting for shortness of breath.  Pt presents with a one day history of SOB. He is short of breath both at rest and with exertion. He denies cough or fevers, or being around any sick contact.  He denies chest pain, headaches, leg pain or swelling.  He has a history of PE, 2021 and is supposed to be on Eliquis.  However for the past 2 months,  he has not been taking Eliquis due to insurance issues. He has been taking baby aspirin instead.    Patient's recorded medical, surgical, social, medication list and allergies were reviewed in the Snapshot window as part of the initial history.   Review of Systems   Review of Systems  Respiratory:  Positive for shortness of breath.     Physical Exam Updated Vital Signs BP 139/73   Pulse 90   Temp 98.2 F (36.8 C) (Oral)   Resp 15   Ht 6\' 2"  (1.88 m)   Wt (!) 181.4 kg   SpO2 98%   BMI 51.36 kg/m  Physical Exam  General: Pleasant, not in acute distress.   CV: RRR. No murmurs, rubs, or gallops. No LE edema Pulmonary: Lungs CTAB. Normal effort. No wheezing or rales. Abdominal: Soft, nontender, nondistended. Normal bowel sounds. Extremities: Palpable pulses. Normal ROM. Skin: Warm and dry. No obvious rash or lesions.  ED Course/ Medical Decision Making/ A&P    Procedures Procedures   Medications Ordered in ED Medications  iohexol (OMNIPAQUE) 350 MG/ML injection 100 mL (100 mLs Intravenous Contrast Given 04/19/23 1117)    Medical Decision Making:    Riley Wells is a 37 y.o. male who presented to the ED today with shortness of breath detailed above.     Patient placed on continuous vitals and telemetry monitoring while in ED which was reviewed periodically.    Complete initial physical exam performed, notably the patient  was stable. Lungs are clear bilaterally no wheezes.  Vital stable.   Reviewed and confirmed nursing documentation for past medical history, family history, social history.    Initial Assessment:   With the patient's presentation of shortness of breath, most likely diagnosis is pulmonary embolism. Other diagnoses were considered including (but not limited to) ACS vs PNA.  EKG with no ischemic changes and Chest x-ray normal makes both ACS and PNA less likely.  This is most consistent with an acute life/limb threatening illness complicated by underlying chronic conditions.  Initial Plan:  Respiratory pathogen panel Screening labs including CBC and Metabolic panel to evaluate for infectious or metabolic etiology of disease.  CXR to evaluate for structural/infectious intrathoracic pathology.  EKG to evaluate for cardiac pathology. Chest CT with contrast to rule out PE.  Objective evaluation as below reviewed with plan for close reassessment  Initial Study Results:   Laboratory  All laboratory results reviewed without evidence of clinically relevant pathology.    EKG was reviewed independently. Rate, rhythm, axis, intervals all examined and without medically relevant abnormality. ST segments without concerns for elevations.    Radiology  All images reviewed independently. I agree with radiology report at this time.   CT Angio Chest PE W and/or Wo Contrast  Result Date: 04/19/2023 CLINICAL  DATA:  Suspected pulmonary embolism EXAM: CT ANGIOGRAPHY CHEST WITH CONTRAST TECHNIQUE: Multidetector CT imaging of the chest was performed using the standard protocol during bolus administration of intravenous contrast. Multiplanar CT image reconstructions and MIPs were obtained to evaluate the vascular anatomy. RADIATION DOSE REDUCTION: This exam was performed according to the departmental dose-optimization program which includes automated  exposure control, adjustment of the mA and/or kV according to patient size and/or use of iterative reconstruction technique. CONTRAST:  OMNIPAQUE IOHEXOL 350 MG/ML SOLN COMPARISON:  Chest x-ray April 19, 2023 FINDINGS: Cardiovascular: Satisfactory opacification of the pulmonary arteries to the segmental level. There is multifocal diffuse acute pulmonary emboli involving bilateral lungs, located most centrally in bilateral distal main pulmonary arteries. There is extension of pulmonary emboli to the segmental and subsegmental pulmonary arteries in bilateral lungs. The main pulmonary artery is dilated, measuring up to 3.7 cm. There is slightly elevated RV to LV ratio, measuring up to 1.1. Normal heart size and no pericardial effusion. Mediastinum/Nodes: No enlarged mediastinal, hilar, or axillary lymph nodes. Thyroid gland, trachea, and esophagus demonstrate no significant findings. Lungs/Pleura: Patent central airways, with minimal layering debris in the distal trachea. No focal pulmonary opacity. No pleural effusion or pneumothorax. Upper Abdomen: No acute abnormality. Musculoskeletal: No chest wall abnormality. No acute or significant osseous findings. Review of the MIP images confirms the above findings. IMPRESSION: 1. Positive for acute pulmonary embolism, involving bilateral distal main pulmonary arteries and extending into the segmental and subsegmental pulmonary arteries in bilateral lungs. 2. Slightly elevated RV to LV ratio and dilated main pulmonary artery, raising concern for right heart strain. These findings were discussed with Dr. Cristal Deer Tegeler by Dr. Jacob Moores at 11:29 am on 04/19/2023. Electronically Signed   By: Jacob Moores M.D.   On: 04/19/2023 11:30   DG Chest 2 View  Result Date: 04/19/2023 CLINICAL DATA:  Shortness of breath EXAM: CHEST - 2 VIEW COMPARISON:  Chest x-ray September 16, 2021 FINDINGS: The cardiomediastinal silhouette is unchanged in contour. No focal pulmonary  opacity. No pleural effusion or pneumothorax. The visualized upper abdomen is unremarkable. No acute osseous abnormality. IMPRESSION: No active cardiopulmonary disease. Electronically Signed   By: Jacob Moores M.D.   On: 04/19/2023 09:42     Consults:  Case discussed with intensivist. He will be admitted to medicine.  Reassessment and Plan:   Shortness of breath not worsening. He is hemodynamically stable.Troponin negative. CT of the chest positive for acute pulmonary embolism, involving bilateral distal main pulmonary arteries and extending into the segmental and subsegmental pulmonary arteries in bilateral lungs . There is also evidence of right heart strain.   We have consulted intensivist, and recommends admission to medicine floor. Also recommends anticoagulation with heparin.  Clinical Impression:  1. Multiple subsegmental pulmonary emboli without acute cor pulmonale (HCC)      Data Unavailable   Final Clinical Impression(s) / ED Diagnoses Final diagnoses:  Multiple subsegmental pulmonary emboli without acute cor pulmonale [facility])    Rx / DC Orders ED Discharge Orders     None         Laretta Bolster, MD 04/19/23 2106    Tegeler, Canary Brim, MD 04/20/23 1706

## 2023-04-19 NOTE — TOC Initial Note (Addendum)
Transition of Care [facility]) - Initial/Assessment Note    Patient Details  Name: Riley Wells MRN: [id number] Date of Birth: June 04, 1986  [facility]) CM/SW Contact:    Kingsley Plan, RN Phone Number: 04/19/2023, 3:08 PM  Clinical Narrative:                  Spoke to patient at bedside   Patient was prescribed Eliquis by PCP. Used 30 day free card for first 30 day supply. Patient has not filled prescription after that due to having no insurance and cannot afford . Patient did not let PCP know.   NCM printed assistance program application  for Eliquis . Provided patient with patient section to complete.   Will ask attending team if they will complete MD section. Patient will need to submit both sections with proof of income etc together.   Will see if MATCH will cover 30 days of Eliquis  TOC can fill Eliquis under MATCH.   Dr Glendale Chard will complete assistance application and return to patient    Patient has been entered in [facility]   smoking cessation resources added to AVS  Expected Discharge Plan: Home/Self Care Barriers to Discharge: Continued Medical Work up   Patient Goals and CMS Choice Patient states their goals for this hospitalization and ongoing recovery are:: to return to home          Expected Discharge Plan and Services In-house Referral: Artist Discharge Planning Services: CM Consult   Living arrangements for the past 2 months: Single Family Home                 DME Arranged: N/A DME Agency: NA       HH Arranged: NA          Prior Living Arrangements/Services Living arrangements for the past 2 months: Single Family Home Lives with:: Spouse Patient language and need for interpreter reviewed:: Yes Do you feel safe going back to the place where you live?: Yes      Need for Family Participation in Patient Care: No (Comment) Care giver support system in place?: Yes (comment)   Criminal Activity/Legal Involvement  Pertinent to Current Situation/Hospitalization: No - Comment as needed  Activities of Daily Living      Permission Sought/Granted   Permission granted to share information with : No              Emotional Assessment Appearance:: Appears stated age Attitude/Demeanor/Rapport: Engaged Affect (typically observed): Accepting Orientation: : Oriented to Self, Oriented to Place, Oriented to  Time, Oriented to Situation Alcohol / Substance Use: Not Applicable Psych Involvement: No (comment)  Admission diagnosis:  Pulmonary embolism (HCC) [I26.99] Multiple subsegmental pulmonary emboli without acute cor pulmonale (HCC) [I26.94] Patient Active Problem List   Diagnosis Date Noted   Acute pulmonary embolism (HCC) 04/19/2023   Pulmonary embolism (HCC) 04/19/2023   Smoking 04/19/2023   Hyponatremia 05/17/2021   AKI (acute kidney injury) (HCC) 05/17/2021   COVID-19 virus infection 05/16/2021   Obesity, Class III, BMI 40-49.9 (morbid obesity) (HCC) 05/16/2021   VTE (venous thromboembolism) 03/07/2020   Former smoker 03/07/2020   OSA on CPAP 03/07/2020   Pulmonary HTN (HCC) 03/07/2020   Obesity 02/23/2020   History of DVT (deep vein thrombosis) 02/23/2020   History of pulmonary embolus (PE) 02/21/2020   PCP:  Montez Hageman, DO Pharmacy:   [facility] [address], [place] - [address] [place]  [address] Phone: [phone] Fax: [phone]  [facility] [address] [place] [address] Phone: [phone] Fax: [phone]  [facility] [address] [place] [address] Phone: [phone] Fax: [phone]  [id number] [place], [place] - [address] AT [facility] OF [address] [place] [address] Phone: [phone] Fax: [phone]     Social Determinants of Health (SDOH) Social History: SDOH Screenings    Tobacco Use: Medium Risk (04/19/2023)   SDOH Interventions:     Readmission Risk Interventions     No data to display

## 2023-04-20 ENCOUNTER — Other Ambulatory Visit (HOSPITAL_COMMUNITY): Payer: Self-pay

## 2023-04-20 ENCOUNTER — Observation Stay (HOSPITAL_BASED_OUTPATIENT_CLINIC_OR_DEPARTMENT_OTHER): Payer: Self-pay

## 2023-04-20 DIAGNOSIS — I2694 Multiple subsegmental pulmonary emboli without acute cor pulmonale: Secondary | ICD-10-CM

## 2023-04-20 LAB — CBC
HCT: 40.9 % (ref 39.0–52.0)
Hemoglobin: 13 g/dL (ref 13.0–17.0)
MCH: 29 pg (ref 26.0–34.0)
MCHC: 31.8 g/dL (ref 30.0–36.0)
MCV: 91.1 fL (ref 80.0–100.0)
Platelets: 197 10*3/uL (ref 150–400)
RBC: 4.49 MIL/uL (ref 4.22–5.81)
RDW: 13.1 % (ref 11.5–15.5)
WBC: 9.6 10*3/uL (ref 4.0–10.5)
nRBC: 0 % (ref 0.0–0.2)

## 2023-04-20 LAB — BASIC METABOLIC PANEL
Anion gap: 11 (ref 5–15)
BUN: 16 mg/dL (ref 6–20)
CO2: 23 mmol/L (ref 22–32)
Calcium: 8.9 mg/dL (ref 8.9–10.3)
Chloride: 106 mmol/L (ref 98–111)
Creatinine, Ser: 1.15 mg/dL (ref 0.61–1.24)
GFR, Estimated: >60 mL/min (ref >60)
Glucose, Bld: 109 mg/dL — ABNORMAL HIGH (ref 70–99)
Potassium: 4.4 mmol/L (ref 3.5–5.1)
Sodium: 140 mmol/L (ref 135–145)

## 2023-04-20 LAB — ECHOCARDIOGRAM COMPLETE
Height: 74 in
S' Lateral: 3.1 cm
Weight: 6400 oz

## 2023-04-20 LAB — HEPARIN LEVEL (UNFRACTIONATED): Heparin Unfractionated: 0.78 IU/mL — ABNORMAL HIGH (ref 0.30–0.70)

## 2023-04-20 MED ORDER — APIXABAN (ELIQUIS) VTE STARTER PACK (10MG AND 5MG)
ORAL_TABLET | ORAL | 0 refills | Status: AC
  Filled 2023-04-20: qty 74, 30d supply, fill #0

## 2023-04-20 MED ORDER — NICOTINE 14 MG/24HR TD PT24
14.0000 mg | MEDICATED_PATCH | Freq: Every day | TRANSDERMAL | 0 refills | Status: AC
  Filled 2023-04-20: qty 28, 28d supply, fill #0

## 2023-04-20 MED ORDER — APIXABAN 5 MG PO TABS
10.0000 mg | ORAL_TABLET | Freq: Two times a day (BID) | ORAL | Status: DC
  Administered 2023-04-20: 10 mg via ORAL
  Filled 2023-04-20: qty 2

## 2023-04-20 MED ORDER — APIXABAN 5 MG PO TABS: 5.0000 mg | ORAL_TABLET | Freq: Two times a day (BID) | ORAL | Status: DC

## 2023-04-20 NOTE — Assessment & Plan Note (Signed)
Patient would like to stop smoking. Has been smoking for 22 years and smokes about 1/2 pack per day. - TOC consulted for smoking cessation resources - Nicotine patch 

## 2023-04-20 NOTE — Discharge Summary (Addendum)
Family Medicine Teaching Service [facility] Discharge Summary  Patient name: Riley Wells Medical record number: [id number] Date of birth: August 22, 1986 Age: 37 y.o. Gender: male Date of Admission: 04/19/2023  Date of Discharge: 04/20/23  Admitting Physician: Carney Living, MD  Primary Care Provider: Montez Hageman, DO Consultants: none  Indication for Hospitalization: Bilateral PE  Brief Hospital Course:  Riley Wells is a 38 y.o.male with a history of PE, VTE, DVT, OSA on CPAP, Pulmonary HTN, and AKI who was admitted to the [facility] Medicine Teaching Service at [facility] for shortness of breath 2/2 acute PE.  His hospital course is detailed below:  Acute Pulmonary Embolism Patient presented to ED with shortness of breath x2 days. He had not been taking his Eliquis for 2 months due to affordability concerns. EKG without ischemic changes. CXR normal. Chest CT showed acute PE involving bilateral distal main pulmonary arteries and extending into the segmental and subsegmental pulmonary arteries in bilateral lungs, with concern for right heart strain. Patient received Heparin in the hospital and will be transitioned to Eliquis outpatient. Patient was provided with paperwork for additional assistance to help him afford Eliquis without insurance in the interim while he awaits medical insurance in September.  Smoking Cessation Patient was provided information about smoking cessation and would like to quit. Interested in using nicotine patches which he was provided in the hospital and would like to continue outpatient.   PCP Follow-up Recommendations: Assist patient if necessary to obtain approval for Eliquis supply to continue being adherent to Eliquis. Patient is discharged with nicotine patches, please continue to assist him in his cessation efforts.   Discharge Diagnoses/Problem List:  Principal Problem:   Acute pulmonary embolism (HCC) Active Problems:   Smoking  Disposition:  home  Discharge Condition: stable  Discharge Exam:  General: NAD. Awake and alert. Cardiovascular: RRR. No M/R/G. Respiratory: CTAB. No wheezing, crackles, or rhonchi Abdomen: soft, nontender, nondistended, normal bowel sounds Extremities: no BLE edema  Significant Procedures: none  Significant Labs and Imaging:  Recent Labs  Lab 04/19/23 0856 04/20/23 0443  WBC 7.9 9.6  HGB 13.2 13.0  HCT 41.8 40.9  PLT 221 197   Recent Labs  Lab 04/19/23 0856 04/20/23 0443  NA 138 140  K 4.2 4.4  CL 106 106  CO2 22 23  GLUCOSE 111* 109*  BUN 13 16  CREATININE 1.22 1.15  CALCIUM 9.2 8.9    Results/Tests Pending at Time of Discharge: none  Discharge Medications:  Allergies as of 04/20/2023   No Known Allergies      Medication List     STOP taking these medications    apixaban 5 MG Tabs tablet Commonly known as: Eliquis Replaced by: Apixaban Starter Pack (10mg  and 5mg )   aspirin EC 81 MG tablet       TAKE these medications    albuterol 108 (90 Base) MCG/ACT inhaler Commonly known as: VENTOLIN HFA Inhale 2 puffs into the lungs once as needed for wheezing or shortness of breath (for Grade 3 or 4 hypersensitivity reaction).   Apixaban Starter Pack (10mg  and 5mg ) Commonly known as: ELIQUIS STARTER PACK Take as directed on package: start with two-5mg  tablets twice daily for 7 days. On day 8, switch to one-5mg  tablet twice daily. Replaces: apixaban 5 MG Tabs tablet   nicotine 14 mg/24hr patch Commonly known as: NICODERM CQ - dosed in mg/24 hours Place 1 patch (14 mg total) onto the skin daily. Start taking on: April 21, 2023  Discharge Instructions: Please refer to Patient Instructions section of EMR for full details.  Patient was counseled important signs and symptoms that should prompt return to medical care, changes in medications, dietary instructions, activity restrictions, and follow up appointments.   Follow-Up Appointments:  Follow-up Information      Montez Hageman, DO Follow up in 1 week(s).   Specialty: Family Medicine Contact information: [phone] [place] [address] [phone]                 Fortunato Curling, DO 04/20/2023, 1:01 PM PGY-1, [facility] Family Medicine    I have evaluated this patient along with Dr. Fatima Blank and reviewed the above note, making necessary revisions.  Dorothyann Gibbs, MD 04/20/2023, 1:19 PM PGY-3, [facility] Family Medicine

## 2023-04-20 NOTE — Progress Notes (Signed)
  Echocardiogram 2D Echocardiogram has been performed.  Delcie Roch 04/20/2023, 8:44 AM

## 2023-04-20 NOTE — Progress Notes (Signed)
Physical Therapy Discharge Patient Details Name: Riley Wells MRN: [id number] DOB: 1986-04-26 Today's Date: 04/20/2023 Time:  -     Patient discharged from PT services secondary to  screen with no needs identified .  Please see latest therapy progress note for current level of functioning and progress toward goals.    Progress and discharge plan discussed with patient and/or caregiver:  Pt has been able to walk his unit with no assist, no symptoms so PT will dc for now.  Please reconsult if his needs change.  GP     Ivar Drape 04/20/2023, 11:05 AM   Samul Dada, PT PhD Acute Rehab Dept. Number: [facility] [id number] and [facility] [phone]

## 2023-04-20 NOTE — Evaluation (Signed)
Occupational Therapy Evaluation Patient Details Name: Riley Wells MRN: [id number] DOB: 08/08/1986 Today's Date: 04/20/2023   History of Present Illness 37 y.o. male presenting with SOB on exertion for the past two days, he was admitted for a acute pulmonary embolism.   Clinical Impression   Patient evaluated by Occupational Therapy with no further acute OT needs identified. All education has been completed and the patient has no further questions. Pt lives with wife at home and is independent with all ADL tasks, functional mobility, including driving. Pt works full time. No SOB noted during rest or activity during session. Able to walk independently completing 1 full loop of unit. No follow-up Occupational Therapy or equipment needs. OT is signing off. Thank you for this referral.       Recommendations for follow up therapy are one component of a multi-disciplinary discharge planning process, led by the attending physician.  Recommendations may be updated based on patient status, additional functional criteria and insurance authorization.   Assistance Recommended at Discharge None     Functional Status Assessment  Patient has not had a recent decline in their functional status  Equipment Recommendations  None recommended by OT       Precautions / Restrictions Precautions Precautions: None Restrictions Weight Bearing Restrictions: No      Mobility Bed Mobility Overal bed mobility: Independent      Patient Response: Cooperative  Transfers Overall transfer level: Independent Equipment used: None         Balance Overall balance assessment: Independent         ADL either performed or assessed with clinical judgement   ADL Overall ADL's : Independent;At baseline        Vision Baseline Vision/History: 1 Wears glasses Ability to See in Adequate Light: 0 Adequate Patient Visual Report: No change from baseline Vision Assessment?: No apparent visual deficits             Pertinent Vitals/Pain Pain Assessment Pain Assessment: No/denies pain     Hand Dominance Right   Extremity/Trunk Assessment Upper Extremity Assessment Upper Extremity Assessment: Overall WFL for tasks assessed   Lower Extremity Assessment Lower Extremity Assessment: Overall WFL for tasks assessed   Cervical / Trunk Assessment Cervical / Trunk Assessment: Normal   Communication Communication Communication: No difficulties   Cognition Arousal/Alertness: Awake/alert Behavior During Therapy: WFL for tasks assessed/performed Overall Cognitive Status: Within Functional Limits for tasks assessed         General Comments  No SOB during rest or activity            Home Living Family/patient expects to be discharged to:: Private residence Living Arrangements: Spouse/significant other Available Help at Discharge: Family;Available 24 hours/day Type of Home: House Home Access: Stairs to enter [employer] of Steps: 2 steps Entrance Stairs-Rails: Right;Left;Can reach both Home Layout: Two level;Laundry or work area in basement;Able to live on main level with bedroom/bathroom Alternate Teacher, music of Steps: flight - to basement Alternate Level Stairs-Rails: Right;Can reach both;Left Bathroom Shower/Tub: Chief Strategy Officer: Standard     Home Equipment: Other (comment)   Additional Comments: CPAP      Prior Functioning/Environment Prior Level of Function : Working/employed      Mobility Comments: Has physical demanding job- on his feet all day, full time, bending and lifting. independent ADLs Comments: independent        OT Problem List: Decreased activity tolerance         OT Goals(Current goals can be  found in the care plan section) Acute Rehab OT Goals Patient Stated Goal: none stated  OT Frequency:  1X visit       AM-PAC OT "6 Clicks" Daily Activity     Outcome Measure Help from another person eating meals?:  None Help from another person taking care of personal grooming?: None Help from another person toileting, which includes using toliet, bedpan, or urinal?: None Help from another person bathing (including washing, rinsing, drying)?: None Help from another person to put on and taking off regular upper body clothing?: None Help from another person to put on and taking off regular lower body clothing?: None 6 Click Score: 24   End of Session    Activity Tolerance: Patient tolerated treatment well Patient left: in bed;with call bell/phone within reach  OT Visit Diagnosis: Muscle weakness (generalized) (M62.81)                Time: 1610-9604 OT Time Calculation (min): 17 min Charges:  OT General Charges $OT Visit: 1 Visit OT Evaluation $OT Eval Low Complexity: 1 Low  Limmie Patricia, OTR/L,CBIS  Supplemental OT - [facility] and [facility] Secure Chat Preferred    Thatiana Renbarger, Charisse March 04/20/2023, 10:40 AM

## 2023-04-20 NOTE — Discharge Instructions (Addendum)
Dear Riley Wells,  Thank you for letting us participate in your care. You were hospitalized for bilateral Acute pulmonary embolism (HCC). You were treated with heparin in the hospital and discharged on Eliquis. It is very importnt that you take this every day. If you have issues with medication access, please ask your PCP office for assistance. You should qualify for patient assistance. We have faxed off an initial application on your behalf and you should be hearing back from them soon.   POST-HOSPITAL & CARE INSTRUCTIONS Go to your follow up appointments (listed below)   DOCTOR'S APPOINTMENT   No future appointments.  Follow-up Information     Montez Hageman, DO Follow up in 1 week(s).   Specialty: Family Medicine Contact information: [phone] [place] [address] [phone]                 Take care and be well!  Family Medicine Teaching Service Inpatient Team [place]  [facility]  [address] [place], [place] [address] [phone]      Information on my medicine - ELIQUIS (apixaban)  This medication education was reviewed with me or my healthcare representative as part of my discharge preparation.   Why was Eliquis prescribed for you? Eliquis was prescribed to treat blood clots that may have been found in the veins of your legs (deep vein thrombosis) or in your lungs (pulmonary embolism) and to reduce the risk of them occurring again.  What do You need to know about Eliquis ? The starting dose is 10 mg (two 5 mg tablets) taken TWICE daily for the FIRST SEVEN (7) DAYS, then on (enter date)  04/27/23  the dose is reduced to ONE 5 mg tablet taken TWICE daily.  Eliquis may be taken with or without food.   Try to take the dose about the same time in the morning and in the evening. If you have difficulty swallowing the tablet whole please discuss with your pharmacist how to take the medication safely.  Take Eliquis  exactly as prescribed and DO NOT stop taking Eliquis without talking to the doctor who prescribed the medication.  Stopping may increase your risk of developing a new blood clot.  Refill your prescription before you run out.  After discharge, you should have regular check-up appointments with your healthcare provider that is prescribing your Eliquis.    What do you do if you miss a dose? If a dose of ELIQUIS is not taken at the scheduled time, take it as soon as possible on the same day and twice-daily administration should be resumed. The dose should not be doubled to make up for a missed dose.  Important Safety Information A possible side effect of Eliquis is bleeding. You should call your healthcare provider right away if you experience any of the following: Bleeding from an injury or your nose that does not stop. Unusual colored urine (red or dark brown) or unusual colored stools (red or black). Unusual bruising for unknown reasons. A serious fall or if you hit your head (even if there is no bleeding).  Some medicines may interact with Eliquis and might increase your risk of bleeding or clotting while on Eliquis. To help avoid this, consult your healthcare provider or pharmacist prior to using any new prescription or non-prescription medications, including herbals, vitamins, non-steroidal anti-inflammatory drugs (NSAIDs) and supplements.  This website has more information on Eliquis (apixaban): http://www.eliquis.com/eliquis/home

## 2023-04-20 NOTE — Progress Notes (Signed)
ANTICOAGULATION CONSULT NOTE - Follow Up Consult  Pharmacy Consult for heparin Indication: pulmonary embolus  Labs: Recent Labs    04/19/23 0856 04/19/23 0938 04/19/23 1310 04/19/23 1858 04/20/23 0443  HGB 13.2  --   --   --  13.0  HCT 41.8  --   --   --  40.9  PLT 221  --   --   --  197  APTT  --   --  29  --   --   HEPARINUNFRC  --   --  <0.10* 0.75* 0.78*  CREATININE 1.22  --   --   --  1.15  TROPONINIHS  --  4  --   --   --     Assessment: 36yo male supratherapeutic on heparin with higher heparin level despite decreased rate; no infusion issues or signs of bleeding per RN.  Goal of Therapy:  Heparin level 0.3-0.7 units/ml   Plan:  Decrease heparin infusion by 2 units/kgABW/hr to 2100 units/hr. Check level in 6 hours.   Vernard Gambles, PharmD, BCPS 04/20/2023 6:41 AM

## 2023-04-21 ENCOUNTER — Other Ambulatory Visit (HOSPITAL_COMMUNITY): Payer: Self-pay

## 2024-03-29 ENCOUNTER — Other Ambulatory Visit (HOSPITAL_COMMUNITY): Payer: Self-pay
